# Patient Record
Sex: Female | Born: 1955 | Race: White | Hispanic: No | Smoking: Current every day smoker
Health system: Southern US, Community
[De-identification: ages and names within clinical notes are randomized; demographics above are authoritative.]

## PROBLEM LIST (undated history)

## (undated) DIAGNOSIS — E785 Hyperlipidemia, unspecified: Secondary | ICD-10-CM

## (undated) DIAGNOSIS — T8859XA Other complications of anesthesia, initial encounter: Secondary | ICD-10-CM

## (undated) DIAGNOSIS — W3400XA Accidental discharge from unspecified firearms or gun, initial encounter: Secondary | ICD-10-CM

## (undated) DIAGNOSIS — T4145XA Adverse effect of unspecified anesthetic, initial encounter: Secondary | ICD-10-CM

## (undated) DIAGNOSIS — E119 Type 2 diabetes mellitus without complications: Secondary | ICD-10-CM

## (undated) DIAGNOSIS — I10 Essential (primary) hypertension: Secondary | ICD-10-CM

## (undated) DIAGNOSIS — E079 Disorder of thyroid, unspecified: Secondary | ICD-10-CM

## (undated) HISTORY — PX: OOPHORECTOMY: SHX86

## (undated) HISTORY — PX: APPENDECTOMY: SHX54

## (undated) HISTORY — PX: ABDOMINAL HYSTERECTOMY: SHX81

## (undated) HISTORY — PX: CYST EXCISION: SHX5701

## (undated) HISTORY — PX: BLADDER REPAIR: SHX76

## (undated) HISTORY — PX: URINARY SPHINCTER REVISION: SHX2625

---

## 1998-03-30 ENCOUNTER — Other Ambulatory Visit: Admission: RE | Admit: 1998-03-30 | Discharge: 1998-03-30 | Payer: Self-pay | Admitting: Sports Medicine

## 1999-10-12 ENCOUNTER — Encounter: Admission: RE | Admit: 1999-10-12 | Discharge: 1999-10-12 | Payer: Self-pay | Admitting: Family Medicine

## 1999-10-19 ENCOUNTER — Emergency Department (HOSPITAL_COMMUNITY): Admission: EM | Admit: 1999-10-19 | Discharge: 1999-10-19 | Payer: Self-pay | Admitting: Emergency Medicine

## 2000-11-12 ENCOUNTER — Other Ambulatory Visit: Admission: RE | Admit: 2000-11-12 | Discharge: 2000-11-12 | Payer: Self-pay | Admitting: Obstetrics and Gynecology

## 2001-03-26 ENCOUNTER — Encounter: Payer: Self-pay | Admitting: Obstetrics and Gynecology

## 2001-04-01 ENCOUNTER — Inpatient Hospital Stay (HOSPITAL_COMMUNITY): Admission: RE | Admit: 2001-04-01 | Discharge: 2001-04-04 | Payer: Self-pay | Admitting: Obstetrics and Gynecology

## 2001-04-01 ENCOUNTER — Encounter (INDEPENDENT_AMBULATORY_CARE_PROVIDER_SITE_OTHER): Payer: Self-pay | Admitting: *Deleted

## 2001-04-20 ENCOUNTER — Encounter: Payer: Self-pay | Admitting: Obstetrics and Gynecology

## 2001-04-20 ENCOUNTER — Inpatient Hospital Stay (HOSPITAL_COMMUNITY): Admission: AD | Admit: 2001-04-20 | Discharge: 2001-04-20 | Payer: Self-pay | Admitting: Obstetrics and Gynecology

## 2006-04-10 ENCOUNTER — Ambulatory Visit: Payer: Self-pay | Admitting: Family Medicine

## 2006-04-15 ENCOUNTER — Ambulatory Visit: Payer: Self-pay | Admitting: Family Medicine

## 2006-06-07 ENCOUNTER — Ambulatory Visit: Payer: Self-pay | Admitting: Family Medicine

## 2006-11-26 ENCOUNTER — Ambulatory Visit: Payer: Self-pay | Admitting: Family Medicine

## 2006-11-26 LAB — CONVERTED CEMR LAB: TSH: 39.66 microintl units/mL — ABNORMAL HIGH (ref 0.35–5.50)

## 2006-12-25 ENCOUNTER — Ambulatory Visit: Payer: Self-pay | Admitting: Family Medicine

## 2006-12-25 LAB — CONVERTED CEMR LAB: TSH: 1.21 microintl units/mL (ref 0.35–5.50)

## 2007-02-24 DIAGNOSIS — E785 Hyperlipidemia, unspecified: Secondary | ICD-10-CM | POA: Insufficient documentation

## 2007-02-24 DIAGNOSIS — E039 Hypothyroidism, unspecified: Secondary | ICD-10-CM

## 2007-02-24 DIAGNOSIS — I10 Essential (primary) hypertension: Secondary | ICD-10-CM | POA: Insufficient documentation

## 2007-10-21 ENCOUNTER — Ambulatory Visit: Payer: Self-pay | Admitting: Internal Medicine

## 2007-10-21 DIAGNOSIS — E782 Mixed hyperlipidemia: Secondary | ICD-10-CM

## 2007-10-21 LAB — CONVERTED CEMR LAB
Cholesterol, target level: 200 mg/dL
HDL goal, serum: 40 mg/dL
LDL Goal: 130 mg/dL

## 2007-10-27 ENCOUNTER — Telehealth: Payer: Self-pay | Admitting: Internal Medicine

## 2008-02-10 ENCOUNTER — Telehealth (INDEPENDENT_AMBULATORY_CARE_PROVIDER_SITE_OTHER): Payer: Self-pay | Admitting: *Deleted

## 2011-02-09 NOTE — Discharge Summary (Signed)
[facility]  Patient:    Sierra Beck, Sierra Beck                      MRN: [id number] Adm. Date:  [id number] Disc. Date: [id number] Attending:  Frederich Balding                           Discharge Summary  ADMITTING DIAGNOSES: 1. Menorrhagia secondary to uterine fibroids. 2. Anatomical stress urinary incontinence.  DISCHARGE DIAGNOSES: 1. Menorrhagia secondary to uterine fibroids. 2. Anatomical stress urinary incontinence.  PROCEDURE:  Total abdominal hysterectomy with left salpingo-oophorectomy, Charletta Cousin procedure.  Cystoscopy with placement of suprapubic catheter.  For complete history and physical, please see dictated note.  HOSPITAL COURSE:  Patient underwent the above-noted surgery.  Pathology did reveal a 128 g uterus with some uterine fibroids, otherwise unremarkable.  Her right tube and ovary were surgically absent.  Left tube and ovary were unremarkable.  Postoperatively, she did well.  Postoperative hemoglobin was 11.7.  She had a little atelectasis on her first postoperative evening that responded to pulmonary toiletry.  Her diet was advanced and we began clamping the suprapubic.  By her third postoperative day, she was doing well; however, that morning, she had some nausea and vomiting and this did resolve with observation and her post-void residuals were minimal.  The suprapubic was discontinued and she was discharged home.  At the time of discharge, she was afebrile with stable vital signs.  Abdomen was soft and nontender.  A low-transverse incision was intact.  She was having minimal vaginal bleeding.  In terms of complications, none were encountered during stay in the hospital. Patient was discharged home in stable condition.  DISPOSITION:  Routine postoperative instruction orders given.  She was to avoid heavy lifting, vaginal entrance, or driving a car.  She was discharged home on Tylox as needed for pain.  Climara patch risks and benefits  have been discussed and iron sulfate supplementation.  She is to watch for signs of infection, nausea, vomiting, increasing abdominal pain, or active vaginal bleeding.  Follow up in the office in one week. DD:  04/04/01 TD:  04/04/01 Job: [id number]

## 2011-02-09 NOTE — H&P (Signed)
[facility]  Patient:    Sierra Beck, Sierra Beck                            MRN: [id number] Adm. Date:  04/01/01 Attending:  Juluis Mire, M.D.                         History and Physical  HISTORY OF PRESENT ILLNESS:  The patient is a 55 year old gravida 3, para 2, abortus 1, married white female who presents for total abdominal hysterectomy with left salpingo-oophorectomy and retropubic suspension of bladder.  The patient does complain of worsening menstrual cycles.  Cycles are now lasting approximately 6 to 7 days, five days of flow changing pads and tampons every four hours with associated clots.  No significant pelvic pain. Ultrasound confirms multiple uterine fibroids.  Also the patient is complaining of worsening stress urinary incontinence, becoming extremely limiting.  Bladder studies were consistent with anatomical stress urinary incontinence.  In view of this, the patient now presents for the above noted surgery.  Of note, she has had a previous right salpingo-oophorectomy.  At the time of the last frozen section she was noted to have extensive pelvic adhesions.  ALLERGIES:  No known drug allergies.  MEDICATIONS:  Synthroid.  PAST MEDICAL HISTORY: 1. Usual childhood disease, not significant. 2. History of hypothyroidism.  PAST SURGICAL HISTORY: 1. Previous right salpingo-oophorectomy. 2. She has had two prior cesarean sections. 3. Bilateral tubal ligation. 4. One TAB.  FAMILY HISTORY:  Noncontributory.  SOCIAL HISTORY:   No tobacco or alcohol use.  REVIEW OF SYSTEMS:  Noncontributory.  PHYSICAL EXAMINATION:  VITAL SIGNS:  The patient is afebrile with stable vital signs.  HEENT:  Normocephalic.  Pupils are equal, round and reactive to light and accommodation.  Extraocular movements intact.  Sclerae and conjunctivae clear. Oropharynx clear.  NECK:  Without thyromegaly.  BREASTS:  No discrete masses.  LUNGS:  Clear.  CARDIAC:   Regular rate and rhythm, no murmurs or gallops.  ABDOMEN:  Benign.  PELVIC:  Normal external genitalia, vaginal cuff clear, cervix unremarkable. Uterus is upper limits of normal size, slightly irregular, adnexa unremarkable.  Rectovaginal examination is clear.  EXTREMITIES:  Trace edema.  NEUROLOGIC:  Grossly within normal limits.  IMPRESSION: 1. Uterine fibroids with associated menorrhagia. 2. Anatomical stress urinary incontinence. 3. Hypothyroidism.  PLAN:  The patient will undergo the above noted surgery.  The risks of surgery have been discussed, including the risk of infection.  The risk of hemorrhage. This could require transfusion with the risks of AIDS or hepatitis.  The risk of injury to adjacent organs, including bladder, bowel, or ureters, that could require further exploratory surgery.  The risk of deep venous thrombosis and pulmonary embolus.  In terms of the bladder suspension and the need for suprapubic catheter placement and bladder retraining have been discussed.  The risk of long-term catheterization discussed.  Sometime surgical re-correction may be undertaken.DD:  04/01/01 TD:  04/01/01 Job: [id number]

## 2011-02-09 NOTE — Op Note (Signed)
[facility]  Patient:    Sierra Beck, Sierra Beck                      MRN: [id number] Proc. Date: 04/01/01 Adm. Date:  [id number] Attending:  Frederich Balding                           Operative Report  PREOPERATIVE DIAGNOSES:  Uterine fibroids. Stress urinary incontinence.  POSTOPERATIVE DIAGNOSES:  Uterine fibroids. Stress urinary incontinence. Pelvic adhesions.  PROCEDURE:  Exploratory laparotomy with lysis of adhesions, total abdominal hysterectomy, left salpingo-oophorectomy. Retropubic suspension of the bladder using a Burch procedure. Cystostomy with cystoscopy. Placement of suprapubic catheter.  SURGEON:  Dr. Arelia Sneddon.  ASSISTANT:  Dr. Marcelle Overlie.  ANESTHESIA:  General endotracheal.  ESTIMATED BLOOD LOSS:  400 cc.  PACKS/DRAINS:  None.  INTRAOPERATIVE BLOOD REPLACED:  None.  COMPLICATIONS:  None.  INDICATIONS FOR PROCEDURE:  Dictated in the history and physical.  DESCRIPTION OF PROCEDURE:  The patient was taken to the operating room and placed in supine position. After a satisfactory level of general endotracheal anesthesia was obtained, the patient was placed in the dorsal lithotomy position using the Allen stirrups. The abdomen, perineum and vagina were prepped out with Betadine and draped as a sterile field. A three way Foley had been put in place. A low transverse skin incision was made with a knife and carried through the subcutaneous tissue. The anterior rectus fascia was entered sharply and incision in fascia extended laterally. The fascia was then taken off the muscle superiorly and inferiorly using both blunt and sharp dissection. The rectus muscles were separated in the midline. The perineum was entered sharply, incision in  perineum extended both superiorly and inferiorly. She did have omental adhesions on the anterior aspect of the incision and these were taken down sharply. The right tube and ovary were surgically absent. The  left ovary was encased in adhesions to the sigmoid colon. These were taken down sharply. The cul-de-sac was basically free. The uterus was upper limits of normal size. Palpation in the upper abdomen revealed both kidneys to be of normal size and shape, the appendix was surgically absent. An OConnor-OSullivan retractor was put in place and bowel contents were packed superiorly out of the pelvic cavity. At this point in time, the left round ligament was clamped, cut and suture ligated with #0 Vicryl. The left retroperitoneal space was developed. The left ureter was identified, the left ovarian vasculature was isolated above the ureter. This was then clamped, cut, and doubly ligated first with a free tie of #0 Vicryl and then a suture ligature of #0 Vicryl. We had good hemostasis. We then developed a bladder flap. There were some adhesions in this area. Next, the right round ligament was clamped, cut and suture ligated with #0 Vicryl. There was no adnexa on this side, therefore, we took down the broad ligament that was remaining. The uterine vessels were skeletonized, clamped, cut and suture ligated with #0 Vicryl. The bladder was further dissected off the lower uterine cervical segment. Using the clamp, cut and tie technique with suture ligatures of #0 Vicryl, the perimetrium was serially separated from the side of the uterus. The vaginal angles were then clamped, cut and suture ligated. The intervening vaginal mucosa was excised. The remaining vaginal mucosa was closed with underlying suture of #0 Vicryl. Some bleeding was noted from the posterior cuff brought under control with the figure-of-eight of #  0 Vicryl. Visualization of the remaining pelvic cavity revealed excellent hemostasis. We thoroughly irrigated the pelvis. The cul-de-sac was short. We did not do a culdoplasty as the colon sat down in this area nicely and due to the previous adhesions, we felt this would stick down nicely. The  left ovarian vasculature was hemostatically intact. Urine output remained clear and adequate. We evaluated the omentum, there was no active bleeding form this. She had a few periumbilical adhesions and these were left intact. At this point in time, all packs were removed along with a self retaining retractor, the peritoneum was closed with a running suture of 2-0 Vicryl.  Next, the retroperitoneal space was developed. A gloved hand was placed in the vaginal vault. We then used individual ligatures of 2-0 Prolene to form the Burch procedure. Two sutures were placed and the urethrovesical angle on each side and then two are placed more laterally. These are all secured at [facility] ligament and tied down. We had excellent elevation of the urethrovesical angle. At this point in time, the bladder was distended with saline. A cystostomy incision was made in the dome of the bladder and the cystoscope introduced. There was not suture material through the mucosa, both ureteral orifices were noted. The patient had been given indigo carmine through the IV. We had free spillage of blue urine bilaterally. Next the cystostomy incision was closed. First the mucosa closed with running suture of 3-0 chromic, the muscularis was brought together with interrupted sutures of 3-0 chromic and then a third layer of 3-0 Vicryl. We then reached into the bladder and the incision was intact with no evidence of leakage of the irrigant. A Bonnano catheter was put in place and secured to the skin. At this point, muscles reapproximated with running suture of 3-0 Vicryl. The fascia was closed with a running suture of #0 PDS, the skin was closed with staples and Steri-Strips. Sponge, needle and instrument counts reported as correct by the circulating nurse x 2. Urine output was adequate. The patient tolerated the procedure well and was extubated and transferred to the recovery room in good condition. DD:  04/02/01 TD:   04/02/01 Job: [id number]

## 2011-02-09 NOTE — Assessment & Plan Note (Signed)
HEALTHCARE                          [facility] OFFICE NOTE   NAME:Sierra Beck, Sierra Beck                      MRN:          [id number]  DATE:04/10/2006                            DOB:          [birth year]    REASON FOR VISIT:  Establish care.  Needs refill on medicines.   [name] is a 55 year old female, who is establishing care here at [facility]  She reports that she was previously seen at [facility] for  medical problems, but decided to be switched over here after having a  disagreement with her previous physician.  She states that she has been  having severe headaches for a while and attributed it to a medicine she was  taking.  Due to that, she stopped all her medicines and this led to a  disagreement with her physician.  After stopping all her medicines, her  headaches have completely resolved.  She then restarted her Ziac and  levothyroxine, and continues to be headache-free.  The medicine that  appeared to have caused her headaches was lovastatin, which, according to  her, also caused headaches in her mother.  She is here to reestablish care.   PAST MEDICAL HISTORY:  1.  Hypertension.  2.  Hyperlipidemia, specifically elevated triglycerides.  3.  Hypothyroidism.   SURGICAL HISTORY:  Hysterectomy due to dysfunctional uterine bleeding.   MEDICATIONS:  1.  Ziac 2.5/6.25 daily.  2.  Levothyroxine 0.112 mg daily.  3.  Over-the-counter sleep aid 1 daily.   ALLERGIES:  CODEINE.   FAMILY HISTORY:  Father and mother have a history of hypertension.   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  She denies any  alcohol use, but smokes 1 pack a day.   REVIEW OF SYSTEMS:  As per HPI, and otherwise unremarkable except for a long  history of chronic insomnia.   OBJECTIVE:  Blood pressure 144/100, weight of 182 pounds, pulse is 72.  GENERAL:  We have a pleasant female in no acute distress, answers questions  appropriately.  HEENT:  Unremarkable.  NECK:  Supple, no lymphadenopathy, carotid bruits or JVD.  LUNGS:  Clear.  HEART:  Regular rate and rhythm, normal S1, S2, no murmurs, gallops or rubs.  EXTREMITIES:  No clubbing, cyanosis or edema.   IMPRESSION:  A 55 year old female with history of hypertension,  hyperlipidemia and hypothyroid disease.  She has been off her medicines for  2 weeks, and is here to reestablish care and obtain refills on her  medications.   PLAN:  1.  She agreed to having a scheduled appointment for a lab visit tomorrow or      the following day, to include lipid, AST, ALT, basic metabolic profile      and a TSH.  2.  I refilled her prescriptions x1.  3.  We will have the patient follow up with me in 4 weeks, and sooner if      needed.  4.  We will refer patient to a nutritionist to help with the elevated      triglycerides.  5.  I did advise that there is a possibility that I may start her on      Niaspan.  I did review the possible side effects, and she is in      agreement if needed.  6.  Further recommendations after review of the laboratory data.                                   Leanne Chang, MD   LA/MedQ  DD:  04/10/2006  DT:  04/11/2006  Job #:  [id number]

## 2013-03-08 ENCOUNTER — Emergency Department (HOSPITAL_BASED_OUTPATIENT_CLINIC_OR_DEPARTMENT_OTHER)
Admission: EM | Admit: 2013-03-08 | Discharge: 2013-03-08 | Disposition: A | Discharge status: Home / Self Care | Attending: Emergency Medicine | Admitting: Emergency Medicine

## 2013-03-08 ENCOUNTER — Encounter (HOSPITAL_BASED_OUTPATIENT_CLINIC_OR_DEPARTMENT_OTHER): Payer: Self-pay

## 2013-03-08 DIAGNOSIS — Z87828 Personal history of other (healed) physical injury and trauma: Secondary | ICD-10-CM | POA: Insufficient documentation

## 2013-03-08 DIAGNOSIS — J45909 Unspecified asthma, uncomplicated: Secondary | ICD-10-CM | POA: Insufficient documentation

## 2013-03-08 DIAGNOSIS — I10 Essential (primary) hypertension: Secondary | ICD-10-CM | POA: Insufficient documentation

## 2013-03-08 DIAGNOSIS — R221 Localized swelling, mass and lump, neck: Secondary | ICD-10-CM | POA: Insufficient documentation

## 2013-03-08 DIAGNOSIS — R220 Localized swelling, mass and lump, head: Secondary | ICD-10-CM | POA: Insufficient documentation

## 2013-03-08 DIAGNOSIS — Z79899 Other long term (current) drug therapy: Secondary | ICD-10-CM | POA: Insufficient documentation

## 2013-03-08 DIAGNOSIS — E079 Disorder of thyroid, unspecified: Secondary | ICD-10-CM | POA: Insufficient documentation

## 2013-03-08 DIAGNOSIS — E785 Hyperlipidemia, unspecified: Secondary | ICD-10-CM | POA: Insufficient documentation

## 2013-03-08 DIAGNOSIS — F17200 Nicotine dependence, unspecified, uncomplicated: Secondary | ICD-10-CM | POA: Insufficient documentation

## 2013-03-08 DIAGNOSIS — L259 Unspecified contact dermatitis, unspecified cause: Secondary | ICD-10-CM | POA: Insufficient documentation

## 2013-03-08 DIAGNOSIS — T7840XA Allergy, unspecified, initial encounter: Secondary | ICD-10-CM

## 2013-03-08 HISTORY — DX: Accidental discharge from unspecified firearms or gun, initial encounter: W34.00XA

## 2013-03-08 HISTORY — DX: Hyperlipidemia, unspecified: E78.5

## 2013-03-08 HISTORY — DX: Essential (primary) hypertension: I10

## 2013-03-08 HISTORY — DX: Disorder of thyroid, unspecified: E07.9

## 2013-03-08 LAB — BASIC METABOLIC PANEL
CO2: 28 mEq/L (ref 19–32)
Calcium: 9.8 mg/dL (ref 8.4–10.5)
Creatinine, Ser: 0.7 mg/dL (ref 0.50–1.10)
GFR calc Af Amer: >90 mL/min (ref >90)
GFR calc non Af Amer: >90 mL/min (ref >90)
Sodium: 141 mEq/L (ref 135–145)

## 2013-03-08 LAB — CBC WITH DIFFERENTIAL/PLATELET
Basophils Absolute: 0 10*3/uL (ref 0.0–0.1)
Basophils Relative: 0 % (ref 0–1)
Eosinophils Relative: 6 % — ABNORMAL HIGH (ref 0–5)
HCT: 47.6 % — ABNORMAL HIGH (ref 36.0–46.0)
Lymphocytes Relative: 29 % (ref 12–46)
MCHC: 34.2 g/dL (ref 30.0–36.0)
MCV: 90.7 fL (ref 78.0–100.0)
Monocytes Absolute: 0.4 10*3/uL (ref 0.1–1.0)
Platelets: 217 10*3/uL (ref 150–400)
RDW: 13 % (ref 11.5–15.5)
WBC: 7 10*3/uL (ref 4.0–10.5)

## 2013-03-08 LAB — URINALYSIS, ROUTINE W REFLEX MICROSCOPIC
Glucose, UA: NEGATIVE mg/dL
Ketones, ur: NEGATIVE mg/dL
Leukocytes, UA: NEGATIVE
Nitrite: NEGATIVE
Protein, ur: NEGATIVE mg/dL
Urobilinogen, UA: 0.2 mg/dL (ref 0.0–1.0)

## 2013-03-08 MED ORDER — FAMOTIDINE IN NACL 20-0.9 MG/50ML-% IV SOLN
20.0000 mg | Freq: Once | INTRAVENOUS | Status: AC
  Administered 2013-03-08: 20 mg via INTRAVENOUS
  Filled 2013-03-08: qty 50

## 2013-03-08 MED ORDER — PREDNISONE 20 MG PO TABS: 40.0000 mg | ORAL_TABLET | Freq: Every day | ORAL | Status: DC

## 2013-03-08 MED ORDER — DIPHENHYDRAMINE HCL 50 MG/ML IJ SOLN
25.0000 mg | Freq: Once | INTRAMUSCULAR | Status: AC
  Administered 2013-03-08: 25 mg via INTRAVENOUS
  Filled 2013-03-08: qty 1

## 2013-03-08 MED ORDER — SODIUM CHLORIDE 0.9 % IV BOLUS (SEPSIS)
1000.0000 mL | Freq: Once | INTRAVENOUS | Status: AC
  Administered 2013-03-08: 1000 mL via INTRAVENOUS

## 2013-03-08 MED ORDER — METHYLPREDNISOLONE SODIUM SUCC 125 MG IJ SOLR
125.0000 mg | Freq: Once | INTRAMUSCULAR | Status: AC
  Administered 2013-03-08: 125 mg via INTRAVENOUS
  Filled 2013-03-08: qty 2

## 2013-03-08 NOTE — ED Notes (Signed)
Two attempts at IV insertion by this RN were unsuccessful, Crystal, RRT to attempt to insert PIV.

## 2013-03-08 NOTE — ED Notes (Signed)
Pt states that she woke up yesterday morning with skin tightness (swelling) to the face, neck, shoulders, and upper back.  Pt states that she reviewed her activies from the day before and found nothing that she may have been allergic to, took benadryl and saw little to no improvement throughout the day despite taking multiple doses.  Pt states that she woke up today with increasing tightness, swelling, and redness to the face, neck, shoulders and upper back.  No airway compromise, maintaining control of secretions well, in no apparent distress at this time.  Vital signs stable, will continue to monitor.
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
## 2013-03-08 NOTE — ED Provider Notes (Signed)
Medical screening examination/treatment/procedure(s) were performed by non-physician practitioner and as supervising physician I was immediately available for consultation/collaboration.  Geoffery Lyons, MD 03/08/13 [phone]

## 2014-03-11 ENCOUNTER — Emergency Department (HOSPITAL_BASED_OUTPATIENT_CLINIC_OR_DEPARTMENT_OTHER)

## 2014-03-11 ENCOUNTER — Encounter (HOSPITAL_BASED_OUTPATIENT_CLINIC_OR_DEPARTMENT_OTHER): Payer: Self-pay | Admitting: Emergency Medicine

## 2014-03-11 ENCOUNTER — Emergency Department (HOSPITAL_BASED_OUTPATIENT_CLINIC_OR_DEPARTMENT_OTHER)
Admission: EM | Admit: 2014-03-11 | Discharge: 2014-03-11 | Disposition: A | Discharge status: Home / Self Care | Attending: Emergency Medicine | Admitting: Emergency Medicine

## 2014-03-11 DIAGNOSIS — H53149 Visual discomfort, unspecified: Secondary | ICD-10-CM | POA: Insufficient documentation

## 2014-03-11 DIAGNOSIS — I10 Essential (primary) hypertension: Secondary | ICD-10-CM | POA: Insufficient documentation

## 2014-03-11 DIAGNOSIS — J45909 Unspecified asthma, uncomplicated: Secondary | ICD-10-CM | POA: Insufficient documentation

## 2014-03-11 DIAGNOSIS — IMO0002 Reserved for concepts with insufficient information to code with codable children: Secondary | ICD-10-CM | POA: Insufficient documentation

## 2014-03-11 DIAGNOSIS — J0100 Acute maxillary sinusitis, unspecified: Secondary | ICD-10-CM | POA: Insufficient documentation

## 2014-03-11 DIAGNOSIS — Z87828 Personal history of other (healed) physical injury and trauma: Secondary | ICD-10-CM | POA: Insufficient documentation

## 2014-03-11 DIAGNOSIS — E079 Disorder of thyroid, unspecified: Secondary | ICD-10-CM | POA: Insufficient documentation

## 2014-03-11 DIAGNOSIS — E785 Hyperlipidemia, unspecified: Secondary | ICD-10-CM | POA: Insufficient documentation

## 2014-03-11 DIAGNOSIS — R110 Nausea: Secondary | ICD-10-CM | POA: Insufficient documentation

## 2014-03-11 DIAGNOSIS — R51 Headache: Secondary | ICD-10-CM | POA: Insufficient documentation

## 2014-03-11 DIAGNOSIS — F17200 Nicotine dependence, unspecified, uncomplicated: Secondary | ICD-10-CM | POA: Insufficient documentation

## 2014-03-11 DIAGNOSIS — R519 Headache, unspecified: Secondary | ICD-10-CM

## 2014-03-11 DIAGNOSIS — Z79899 Other long term (current) drug therapy: Secondary | ICD-10-CM | POA: Insufficient documentation

## 2014-03-11 MED ORDER — DIPHENHYDRAMINE HCL 50 MG/ML IJ SOLN
25.0000 mg | Freq: Once | INTRAMUSCULAR | Status: AC
  Administered 2014-03-11: 25 mg via INTRAVENOUS
  Filled 2014-03-11: qty 1

## 2014-03-11 MED ORDER — KETOROLAC TROMETHAMINE 30 MG/ML IJ SOLN
30.0000 mg | Freq: Once | INTRAMUSCULAR | Status: AC
  Administered 2014-03-11: 30 mg via INTRAVENOUS
  Filled 2014-03-11: qty 1

## 2014-03-11 MED ORDER — AMOXICILLIN 500 MG PO CAPS: 500.0000 mg | ORAL_CAPSULE | Freq: Three times a day (TID) | ORAL | Status: DC

## 2014-03-11 MED ORDER — HYDROMORPHONE HCL PF 1 MG/ML IJ SOLN
1.0000 mg | Freq: Once | INTRAMUSCULAR | Status: AC
  Administered 2014-03-11: 1 mg via INTRAVENOUS
  Filled 2014-03-11: qty 1

## 2014-03-11 MED ORDER — SODIUM CHLORIDE 0.9 % IV BOLUS (SEPSIS)
1000.0000 mL | Freq: Once | INTRAVENOUS | Status: AC
  Administered 2014-03-11: 1000 mL via INTRAVENOUS

## 2014-03-11 MED ORDER — METOCLOPRAMIDE HCL 5 MG/ML IJ SOLN
10.0000 mg | Freq: Once | INTRAMUSCULAR | Status: AC
  Administered 2014-03-11: 10 mg via INTRAVENOUS
  Filled 2014-03-11: qty 2
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
## 2014-03-11 NOTE — ED Notes (Signed)
Headache x 5 days. She drove herself here.

## 2014-03-11 NOTE — ED Provider Notes (Signed)
Medical screening examination/treatment/procedure(s) were performed by non-physician practitioner and as supervising physician I was immediately available for consultation/collaboration.   EKG Interpretation None       Orlie Dakin, MD 03/11/14 2358

## 2014-03-11 NOTE — Discharge Instructions (Signed)
Take amoxicillin as directed until gone. Refer to attached documents for more information.  °

## 2015-03-14 ENCOUNTER — Encounter: Payer: Self-pay | Admitting: Nurse Practitioner

## 2017-03-13 ENCOUNTER — Other Ambulatory Visit: Payer: Self-pay | Admitting: Sports Medicine

## 2017-03-13 DIAGNOSIS — M546 Pain in thoracic spine: Secondary | ICD-10-CM

## 2017-03-18 ENCOUNTER — Other Ambulatory Visit: Payer: Self-pay | Admitting: Sports Medicine

## 2017-03-18 ENCOUNTER — Ambulatory Visit
Admission: RE | Admit: 2017-03-18 | Discharge: 2017-03-18 | Disposition: A | Discharge status: Home / Self Care | Source: Ambulatory Visit | Attending: Sports Medicine | Admitting: Sports Medicine

## 2017-03-18 ENCOUNTER — Ambulatory Visit
Admission: RE | Admit: 2017-03-18 | Discharge: 2017-03-18 | Disposition: A | Discharge status: Home / Self Care | Payer: Self-pay | Source: Ambulatory Visit | Attending: Sports Medicine | Admitting: Sports Medicine

## 2017-03-18 DIAGNOSIS — M546 Pain in thoracic spine: Secondary | ICD-10-CM

## 2017-06-10 ENCOUNTER — Encounter: Payer: Self-pay | Admitting: Gastroenterology

## 2017-06-11 ENCOUNTER — Encounter (HOSPITAL_COMMUNITY): Payer: Self-pay | Admitting: *Deleted

## 2017-06-11 ENCOUNTER — Inpatient Hospital Stay (HOSPITAL_COMMUNITY)
Admission: EM | Admit: 2017-06-11 | Discharge: 2017-06-19 | DRG: 357 | Disposition: A | Discharge status: Hospice | Attending: Nephrology | Admitting: Nephrology

## 2017-06-11 ENCOUNTER — Emergency Department (HOSPITAL_COMMUNITY)

## 2017-06-11 DIAGNOSIS — R188 Other ascites: Secondary | ICD-10-CM | POA: Diagnosis present

## 2017-06-11 DIAGNOSIS — K5900 Constipation, unspecified: Secondary | ICD-10-CM | POA: Diagnosis not present

## 2017-06-11 DIAGNOSIS — R1110 Vomiting, unspecified: Secondary | ICD-10-CM

## 2017-06-11 DIAGNOSIS — C801 Malignant (primary) neoplasm, unspecified: Secondary | ICD-10-CM | POA: Diagnosis not present

## 2017-06-11 DIAGNOSIS — Z23 Encounter for immunization: Secondary | ICD-10-CM

## 2017-06-11 DIAGNOSIS — Z885 Allergy status to narcotic agent status: Secondary | ICD-10-CM

## 2017-06-11 DIAGNOSIS — Z515 Encounter for palliative care: Secondary | ICD-10-CM

## 2017-06-11 DIAGNOSIS — R1900 Intra-abdominal and pelvic swelling, mass and lump, unspecified site: Secondary | ICD-10-CM | POA: Diagnosis not present

## 2017-06-11 DIAGNOSIS — C259 Malignant neoplasm of pancreas, unspecified: Secondary | ICD-10-CM | POA: Diagnosis present

## 2017-06-11 DIAGNOSIS — R112 Nausea with vomiting, unspecified: Secondary | ICD-10-CM | POA: Diagnosis present

## 2017-06-11 DIAGNOSIS — R630 Anorexia: Secondary | ICD-10-CM | POA: Diagnosis not present

## 2017-06-11 DIAGNOSIS — E119 Type 2 diabetes mellitus without complications: Secondary | ICD-10-CM | POA: Diagnosis present

## 2017-06-11 DIAGNOSIS — K869 Disease of pancreas, unspecified: Secondary | ICD-10-CM | POA: Diagnosis not present

## 2017-06-11 DIAGNOSIS — K8689 Other specified diseases of pancreas: Secondary | ICD-10-CM

## 2017-06-11 DIAGNOSIS — Z90710 Acquired absence of both cervix and uterus: Secondary | ICD-10-CM | POA: Diagnosis not present

## 2017-06-11 DIAGNOSIS — Z7982 Long term (current) use of aspirin: Secondary | ICD-10-CM | POA: Diagnosis not present

## 2017-06-11 DIAGNOSIS — C787 Secondary malignant neoplasm of liver and intrahepatic bile duct: Secondary | ICD-10-CM | POA: Diagnosis present

## 2017-06-11 DIAGNOSIS — E876 Hypokalemia: Secondary | ICD-10-CM | POA: Diagnosis present

## 2017-06-11 DIAGNOSIS — I10 Essential (primary) hypertension: Secondary | ICD-10-CM | POA: Diagnosis present

## 2017-06-11 DIAGNOSIS — G893 Neoplasm related pain (acute) (chronic): Secondary | ICD-10-CM | POA: Diagnosis present

## 2017-06-11 DIAGNOSIS — D638 Anemia in other chronic diseases classified elsewhere: Secondary | ICD-10-CM | POA: Diagnosis present

## 2017-06-11 DIAGNOSIS — C786 Secondary malignant neoplasm of retroperitoneum and peritoneum: Secondary | ICD-10-CM | POA: Diagnosis present

## 2017-06-11 DIAGNOSIS — C799 Secondary malignant neoplasm of unspecified site: Secondary | ICD-10-CM | POA: Diagnosis present

## 2017-06-11 DIAGNOSIS — F419 Anxiety disorder, unspecified: Secondary | ICD-10-CM | POA: Diagnosis present

## 2017-06-11 DIAGNOSIS — R1084 Generalized abdominal pain: Secondary | ICD-10-CM

## 2017-06-11 DIAGNOSIS — R109 Unspecified abdominal pain: Secondary | ICD-10-CM | POA: Diagnosis present

## 2017-06-11 DIAGNOSIS — E785 Hyperlipidemia, unspecified: Secondary | ICD-10-CM | POA: Diagnosis present

## 2017-06-11 DIAGNOSIS — R634 Abnormal weight loss: Secondary | ICD-10-CM | POA: Diagnosis not present

## 2017-06-11 DIAGNOSIS — F17210 Nicotine dependence, cigarettes, uncomplicated: Secondary | ICD-10-CM | POA: Diagnosis present

## 2017-06-11 DIAGNOSIS — Z79899 Other long term (current) drug therapy: Secondary | ICD-10-CM

## 2017-06-11 DIAGNOSIS — K5909 Other constipation: Secondary | ICD-10-CM | POA: Diagnosis not present

## 2017-06-11 HISTORY — DX: Type 2 diabetes mellitus without complications: E11.9

## 2017-06-11 HISTORY — DX: Adverse effect of unspecified anesthetic, initial encounter: T41.45XA

## 2017-06-11 HISTORY — DX: Other complications of anesthesia, initial encounter: T88.59XA

## 2017-06-11 LAB — COMPREHENSIVE METABOLIC PANEL
ALT: 13 U/L — ABNORMAL LOW (ref 14–54)
ANION GAP: 11 (ref 5–15)
AST: 23 U/L (ref 15–41)
Albumin: 3.2 g/dL — ABNORMAL LOW (ref 3.5–5.0)
Alkaline Phosphatase: 133 U/L — ABNORMAL HIGH (ref 38–126)
BILIRUBIN TOTAL: 0.4 mg/dL (ref 0.3–1.2)
BUN: 12 mg/dL (ref 6–20)
CHLORIDE: 99 mmol/L — AB (ref 101–111)
CO2: 26 mmol/L (ref 22–32)
Calcium: 8.9 mg/dL (ref 8.9–10.3)
Creatinine, Ser: 0.71 mg/dL (ref 0.44–1.00)
Glucose, Bld: 127 mg/dL — ABNORMAL HIGH (ref 65–99)
POTASSIUM: 3.3 mmol/L — AB (ref 3.5–5.1)
Sodium: 136 mmol/L (ref 135–145)
TOTAL PROTEIN: 6.7 g/dL (ref 6.5–8.1)

## 2017-06-11 LAB — URINALYSIS, ROUTINE W REFLEX MICROSCOPIC
BILIRUBIN URINE: NEGATIVE
GLUCOSE, UA: NEGATIVE mg/dL
HGB URINE DIPSTICK: NEGATIVE
Ketones, ur: NEGATIVE mg/dL
LEUKOCYTES UA: NEGATIVE
NITRITE: NEGATIVE
Protein, ur: 100 mg/dL — AB
Specific Gravity, Urine: 1.023 (ref 1.005–1.030)
pH: 6 (ref 5.0–8.0)

## 2017-06-11 LAB — CBC
HEMATOCRIT: 36.7 % (ref 36.0–46.0)
Hemoglobin: 11.8 g/dL — ABNORMAL LOW (ref 12.0–15.0)
MCH: 28.8 pg (ref 26.0–34.0)
MCHC: 32.2 g/dL (ref 30.0–36.0)
MCV: 89.5 fL (ref 78.0–100.0)
Platelets: 345 10*3/uL (ref 150–400)
RBC: 4.1 MIL/uL (ref 3.87–5.11)
RDW: 13.2 % (ref 11.5–15.5)
WBC: 9.8 10*3/uL (ref 4.0–10.5)

## 2017-06-11 LAB — LIPASE, BLOOD: LIPASE: 24 U/L (ref 11–51)

## 2017-06-11 MED ORDER — POTASSIUM CHLORIDE CRYS ER 20 MEQ PO TBCR
20.0000 meq | EXTENDED_RELEASE_TABLET | Freq: Once | ORAL | Status: AC
  Administered 2017-06-11: 20 meq via ORAL
  Filled 2017-06-11: qty 1

## 2017-06-11 MED ORDER — MORPHINE SULFATE (PF) 4 MG/ML IV SOLN
2.0000 mg | INTRAVENOUS | Status: DC | PRN
  Administered 2017-06-11 – 2017-06-12 (×3): 4 mg via INTRAVENOUS
  Filled 2017-06-11 (×3): qty 1

## 2017-06-11 MED ORDER — IOPAMIDOL (ISOVUE-300) INJECTION 61%
INTRAVENOUS | Status: AC
  Administered 2017-06-11: 100 mL
  Filled 2017-06-11: qty 100

## 2017-06-11 MED ORDER — LEVOTHYROXINE SODIUM 88 MCG PO TABS
88.0000 ug | ORAL_TABLET | Freq: Every day | ORAL | Status: DC
  Administered 2017-06-12 – 2017-06-19 (×8): 88 ug via ORAL
  Filled 2017-06-11 (×9): qty 1

## 2017-06-11 MED ORDER — NAPROXEN 250 MG PO TABS
250.0000 mg | ORAL_TABLET | Freq: Two times a day (BID) | ORAL | Status: DC | PRN
  Administered 2017-06-18: 500 mg via ORAL
  Filled 2017-06-11: qty 2

## 2017-06-11 MED ORDER — PRAMIPEXOLE DIHYDROCHLORIDE 0.125 MG PO TABS
0.1250 mg | ORAL_TABLET | Freq: Every day | ORAL | Status: DC
  Administered 2017-06-12 – 2017-06-18 (×8): 0.125 mg via ORAL
  Filled 2017-06-11 (×8): qty 1

## 2017-06-11 MED ORDER — SODIUM CHLORIDE 0.9 % IV SOLN
INTRAVENOUS | Status: DC
  Administered 2017-06-11 – 2017-06-14 (×4): via INTRAVENOUS
  Administered 2017-06-15: 1 mL via INTRAVENOUS
  Administered 2017-06-16 – 2017-06-18 (×4): via INTRAVENOUS

## 2017-06-11 MED ORDER — SIMETHICONE 80 MG PO CHEW
80.0000 mg | CHEWABLE_TABLET | Freq: Four times a day (QID) | ORAL | Status: DC | PRN
  Administered 2017-06-13: 80 mg via ORAL
  Filled 2017-06-11: qty 1

## 2017-06-11 MED ORDER — MORPHINE SULFATE (PF) 4 MG/ML IV SOLN
4.0000 mg | Freq: Once | INTRAVENOUS | Status: AC
  Administered 2017-06-11: 4 mg via INTRAVENOUS
  Filled 2017-06-11: qty 1

## 2017-06-11 MED ORDER — ONDANSETRON HCL 4 MG/2ML IJ SOLN
4.0000 mg | Freq: Once | INTRAMUSCULAR | Status: AC
  Administered 2017-06-11: 4 mg via INTRAVENOUS
  Filled 2017-06-11: qty 2

## 2017-06-11 MED ORDER — ASPIRIN EC 81 MG PO TBEC
81.0000 mg | DELAYED_RELEASE_TABLET | Freq: Every day | ORAL | Status: DC
  Administered 2017-06-12 – 2017-06-19 (×7): 81 mg via ORAL
  Filled 2017-06-11 (×8): qty 1

## 2017-06-11 MED ORDER — LORAZEPAM 2 MG/ML IJ SOLN: 2.0000 mg | Freq: Once | INTRAMUSCULAR | Status: DC

## 2017-06-11 MED ORDER — ONDANSETRON HCL 4 MG/2ML IJ SOLN: 4.0000 mg | Freq: Four times a day (QID) | INTRAMUSCULAR | Status: DC | PRN

## 2017-06-11 MED ORDER — ACETAMINOPHEN 325 MG PO TABS: 650.0000 mg | ORAL_TABLET | Freq: Four times a day (QID) | ORAL | Status: DC | PRN

## 2017-06-11 MED ORDER — DIPHENHYDRAMINE-APAP (SLEEP) 25-500 MG PO TABS: 2.0000 | ORAL_TABLET | Freq: Every day | ORAL | Status: DC

## 2017-06-11 MED ORDER — PANTOPRAZOLE SODIUM 20 MG PO TBEC
20.0000 mg | DELAYED_RELEASE_TABLET | Freq: Every day | ORAL | Status: DC
  Administered 2017-06-12 – 2017-06-14 (×3): 20 mg via ORAL
  Filled 2017-06-11 (×3): qty 1

## 2017-06-11 MED ORDER — ACETAMINOPHEN 500 MG PO TABS
1000.0000 mg | ORAL_TABLET | Freq: Every day | ORAL | Status: DC
  Administered 2017-06-12 – 2017-06-18 (×8): 1000 mg via ORAL
  Filled 2017-06-11 (×8): qty 2

## 2017-06-11 MED ORDER — ONDANSETRON HCL 4 MG PO TABS: 4.0000 mg | ORAL_TABLET | Freq: Four times a day (QID) | ORAL | Status: DC | PRN

## 2017-06-11 MED ORDER — BISOPROLOL-HYDROCHLOROTHIAZIDE 2.5-6.25 MG PO TABS
1.0000 | ORAL_TABLET | Freq: Every day | ORAL | Status: DC
  Administered 2017-06-12 – 2017-06-19 (×7): 1 via ORAL
  Filled 2017-06-11 (×8): qty 1

## 2017-06-11 MED ORDER — DIPHENHYDRAMINE HCL 25 MG PO CAPS
50.0000 mg | ORAL_CAPSULE | Freq: Every day | ORAL | Status: DC
  Administered 2017-06-11 – 2017-06-18 (×8): 50 mg via ORAL
  Filled 2017-06-11 (×8): qty 2

## 2017-06-11 MED ORDER — ROSUVASTATIN CALCIUM 10 MG PO TABS
10.0000 mg | ORAL_TABLET | Freq: Every evening | ORAL | Status: DC
  Administered 2017-06-12 – 2017-06-18 (×7): 10 mg via ORAL
  Filled 2017-06-11 (×8): qty 1

## 2017-06-11 MED ORDER — ACETAMINOPHEN 650 MG RE SUPP: 650.0000 mg | Freq: Four times a day (QID) | RECTAL | Status: DC | PRN
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
## 2017-06-11 NOTE — ED Triage Notes (Signed)
Pt reports ongoing back pain that radiates to her abdomen. Pt states that this has been ongoing for several months. Pt states that she has seen a MD and had CT and xrays completed.
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
## 2017-06-11 NOTE — ED Notes (Signed)
Patient transported to CT 

## 2017-06-11 NOTE — ED Notes (Signed)
Attempted X2 to get blood unsuccessfull

## 2017-06-12 ENCOUNTER — Encounter (HOSPITAL_COMMUNITY): Payer: Self-pay | Admitting: Radiology

## 2017-06-12 ENCOUNTER — Inpatient Hospital Stay (HOSPITAL_COMMUNITY)

## 2017-06-12 HISTORY — PX: IR PARACENTESIS: IMG2679

## 2017-06-12 LAB — CBC
HEMATOCRIT: 33.5 % — AB (ref 36.0–46.0)
Hemoglobin: 10.8 g/dL — ABNORMAL LOW (ref 12.0–15.0)
MCH: 28.9 pg (ref 26.0–34.0)
MCHC: 32.2 g/dL (ref 30.0–36.0)
MCV: 89.6 fL (ref 78.0–100.0)
PLATELETS: 295 10*3/uL (ref 150–400)
RBC: 3.74 MIL/uL — ABNORMAL LOW (ref 3.87–5.11)
RDW: 13.5 % (ref 11.5–15.5)
WBC: 8.2 10*3/uL (ref 4.0–10.5)

## 2017-06-12 LAB — BASIC METABOLIC PANEL
ANION GAP: 5 (ref 5–15)
BUN: 11 mg/dL (ref 6–20)
CALCIUM: 8.5 mg/dL — AB (ref 8.9–10.3)
CO2: 27 mmol/L (ref 22–32)
Chloride: 104 mmol/L (ref 101–111)
Creatinine, Ser: 0.76 mg/dL (ref 0.44–1.00)
Glucose, Bld: 97 mg/dL (ref 65–99)
Potassium: 4.5 mmol/L (ref 3.5–5.1)
Sodium: 136 mmol/L (ref 135–145)

## 2017-06-12 LAB — BODY FLUID CELL COUNT WITH DIFFERENTIAL
Eos, Fluid: NONE SEEN %
Lymphs, Fluid: 63 %
Monocyte-Macrophage-Serous Fluid: 35 % — ABNORMAL LOW (ref 50–90)
Neutrophil Count, Fluid: 2 % (ref 0–25)
WBC FLUID: 840 uL (ref 0–1000)

## 2017-06-12 LAB — AMYLASE, PLEURAL OR PERITONEAL FLUID: AMYLASE FL: 18 U/L

## 2017-06-12 LAB — HIV ANTIBODY (ROUTINE TESTING W REFLEX): HIV SCREEN 4TH GENERATION: NONREACTIVE

## 2017-06-12 LAB — GRAM STAIN

## 2017-06-12 LAB — PROTEIN, PLEURAL OR PERITONEAL FLUID: TOTAL PROTEIN, FLUID: 5 g/dL

## 2017-06-12 MED ORDER — OXYCODONE HCL 5 MG PO TABS
15.0000 mg | ORAL_TABLET | ORAL | Status: DC | PRN
  Administered 2017-06-12 – 2017-06-13 (×2): 15 mg via ORAL
  Filled 2017-06-12 (×2): qty 3

## 2017-06-12 MED ORDER — LIDOCAINE HCL (PF) 1 % IJ SOLN
INTRAMUSCULAR | Status: AC
  Administered 2017-06-12: 11:00:00
  Filled 2017-06-12: qty 30

## 2017-06-12 MED ORDER — OXYCODONE HCL 5 MG PO TABS
5.0000 mg | ORAL_TABLET | ORAL | Status: DC | PRN
  Filled 2017-06-12: qty 1

## 2017-06-12 MED ORDER — OXYCODONE HCL 5 MG PO TABS
10.0000 mg | ORAL_TABLET | ORAL | Status: DC | PRN
  Administered 2017-06-12: 10 mg via ORAL
  Filled 2017-06-12: qty 2

## 2017-06-12 MED ORDER — MORPHINE SULFATE (PF) 4 MG/ML IV SOLN
4.0000 mg | INTRAVENOUS | Status: DC | PRN
  Administered 2017-06-12: 4 mg via INTRAVENOUS
  Filled 2017-06-12: qty 1
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
## 2017-06-12 NOTE — Progress Notes (Signed)
PROGRESS NOTE  SARI COGAN  ION:[id number] DOB: [birth year] DOA: 06/11/2017 PCP: Hendricks Limes, MD   Brief Narrative: Sierra Beck is a 61 y.o. female with a history of HTN, HLD, back pain and tobacco use who prsented to the ED with a week of acutely worsening, constant, severe generalized abdominal pain associated with nausea and vomiting. Abdominal pain first started a few months ago. CT abdomen/pelvis showed peritoneal carcinomatosis, 4.4cm in distal body and tail of the pancreas, moderate ascites, and hypodense masses in the right hepatic lobe. IR was consulted for paracentesis and biopsy, though they opted to follow cytology of paracentesis before considering biopsy.   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 IR prefers to follow cytology prior to attempting biopsy. - Oncology consulted.   Nausea, vomiting, abdominal pain: Suspect related to intraabdominal masses and ascites.  - OxyIR prn moderate pain, IV morphine prn severe pain, also can take NSAID.  - Continue antiemetic - Clear liquid diet, advance as tolerated.   HTN: Chronic, stable - Continue home medications.  DVT prophylaxis: SCDs Code Status: Full Family Communication: None at bedside Disposition Plan: Anticipate DC home once pain is controlled, currently requiring IV pain medications.   Consultants:   GI, [facility]  Oncology, Mohamed  Procedures:   Paracentesis 9/19  Antimicrobials:  None   Subjective: Abdominal pain is severe, generalized, improved slightly with paracentesis. Still 4mg  morphine not helping.   Objective: Vitals:   06/11/17 2200 06/11/17 2254 06/12/17 0608 06/12/17 1351  BP: 113/65 135/65 130/71 132/75  Pulse: 65 67 86 85  Resp:  18 19 19     Temp:  98.1 F (36.7 C) 98.2 F (36.8 C) 98.3 F (36.8 C)  TempSrc:  Oral Oral Oral  SpO2:  98% 98% 99%    Intake/Output Summary (Last 24 hours) at 06/12/17 1535 Last data filed at 06/12/17 1007  Gross per 24 hour  Intake              240 ml  Output                0 ml  Net              240 ml    Gen: 61 y.o. female in apparent pain. Pulm: Non-labored breathing room air. Clear to auscultation bilaterally.  CV: Regular rate and rhythm. No murmur, rub, or gallop. No JVD, no pedal edema. GI: Abdomen soft, diffusely tender to palpation without rebound or guarding, non-distended, +BS. Ext: Warm, no deformities Skin: No rashes, lesions no ulcers Neuro: Alert and oriented. No focal neurological deficits. Psych: Judgement and insight appear normal. Mood & affect appropriate.   Data Reviewed: I have personally reviewed following labs and imaging studies  CBC:  Recent Labs Lab 06/11/17 1426 06/12/17 0439  WBC 9.8 8.2  HGB 11.8* 10.8*  HCT 36.7 33.5*  MCV 89.5 89.6  PLT 345 244   Basic Metabolic Panel:  Recent Labs Lab 06/11/17 1426 06/12/17 0439  NA 136 136  K 3.3* 4.5  CL 99* 104  CO2 26 27  GLUCOSE 127* 97  BUN 12 11  CREATININE 0.71 0.76  CALCIUM 8.9 8.5*   GFR: CrCl cannot be calculated (Unknown ideal weight.). Liver Function Tests:  Recent Labs Lab 06/11/17 1426  AST 23  ALT 13*  ALKPHOS 133*  BILITOT 0.4  PROT 6.7  ALBUMIN 3.2*    Recent Labs Lab 06/11/17 1426  LIPASE 24   No results for input(s): AMMONIA in the last 168 hours. Coagulation Profile: No results for input(s): INR, PROTIME in the last 168 hours. Cardiac Enzymes: No results for input(s): CKTOTAL, CKMB, CKMBINDEX, TROPONINI in the last 168 hours. BNP (last 3 results) No results for input(s): PROBNP in the last 8760 hours. HbA1C: No results for input(s): HGBA1C in the last 72 hours. CBG: No results for input(s): GLUCAP in the last 168 hours. Lipid Profile: No results for  input(s): CHOL, HDL, LDLCALC, TRIG, CHOLHDL, LDLDIRECT in the last 72 hours. Thyroid Function Tests: No results for input(s): TSH, T4TOTAL, FREET4, T3FREE, THYROIDAB in the last 72 hours. Anemia Panel: No results for input(s): VITAMINB12, FOLATE, FERRITIN, TIBC, IRON, RETICCTPCT in the last 72 hours. Urine analysis:    Component Value Date/Time   COLORURINE AMBER (A) 06/11/2017 1830   APPEARANCEUR HAZY (A) 06/11/2017 1830   LABSPEC 1.023 06/11/2017 1830   PHURINE 6.0 06/11/2017 1830   GLUCOSEU NEGATIVE 06/11/2017 1830   HGBUR NEGATIVE 06/11/2017 1830   BILIRUBINUR NEGATIVE 06/11/2017 1830   KETONESUR NEGATIVE 06/11/2017 1830   PROTEINUR 100 (A) 06/11/2017 1830   UROBILINOGEN 0.2 03/08/2013 1333   NITRITE NEGATIVE 06/11/2017 1830   LEUKOCYTESUR NEGATIVE 06/11/2017 1830   No results found for this or any previous visit (from the past 240 hour(s)).    Radiology Studies: Ct Abdomen Pelvis W Contrast  Result Date: 06/11/2017 CLINICAL DATA:  Abdomen pain with nausea and vomiting EXAM: CT ABDOMEN AND PELVIS WITH CONTRAST TECHNIQUE: Multidetector CT imaging of the abdomen and pelvis was performed using the standard protocol following bolus administration of intravenous contrast. CONTRAST:  100 mL Isovue-300 intravenous COMPARISON:  None. FINDINGS: Lower chest: Trace right pleural effusion. Bandlike atelectasis in the right lower lobe. No focal consolidation. Heart size within normal limits Hepatobiliary: Vague hypodense lesions are suspected within the right hepatic lobe, measuring 2.9 cm and 2.4 cm. No calcified gallstones. No biliary dilatation. Pancreas: Ill-defined mass in the distal body of the pancreas measuring approximately 3.1 x 4.4 cm. Truncated appearance of pancreatic tail which may be secondary to marked atrophy. Spleen: Small fluid along the posterior, medial aspect of spleen. No definite focal abnormality Adrenals/Urinary Tract: Bilateral adrenal gland nodules, measuring 16 mm on the  right and 14 mm on the left. Kidneys show no hydronephrosis. The bladder is normal Stomach/Bowel: Stomach is nonenlarged. No dilated small bowel to suggest obstruction. No colon wall thickening. Appendix not well identified. Vascular/Lymphatic: Aortic atherosclerosis. No aneurysmal dilatation. Multiple small gastrohepatic lymph nodes. Multiple small central mesenteric lymph nodes. Reproductive: Status post hysterectomy. No adnexal masses. Other: Moderate ascites within the abdomen and pelvis.Diffuse nodular infiltration of the omentum and anterior mesentery with large plaque-like mass along the omentum. Musculoskeletal: No acute or suspicious bone lesion IMPRESSION: 1. Moderate ascites within the abdomen and pelvis. 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 2. Ill-defined approximate 4.4 cm hypodense mass within the distal body and tail of the pancreas, suspect for neoplasm 3. Bilateral adrenal gland nodules, indeterminate but suspicious given the peritoneal findings. 4. Suspected hypodense masses within the liver, cannot exclude metastatic foci. Nonemergent liver MRI may be helpful for further evaluation if/ when patient is able to breath hold adequately. 5. Trace right pleural  effusion. Electronically Signed   By: Donavan Foil M.D.   On: 06/11/2017 20:06    Scheduled Meds: . diphenhydrAMINE  50 mg Oral QHS   And  . acetaminophen  1,000 mg Oral QHS  . aspirin EC  81 mg Oral Daily  . bisoprolol-hydrochlorothiazide  1 tablet Oral Daily  . levothyroxine  88 mcg Oral QAC breakfast  . pantoprazole  20 mg Oral Daily  . pramipexole  0.125 mg Oral QHS  . rosuvastatin  10 mg Oral QPM   Continuous Infusions: . sodium chloride 75 mL/hr at 06/12/17 1410     LOS: 1 day   Time spent: 25 minutes.  Vance Gather, MD [facility] Pager  [phone]  If 7PM-7AM, please contact night-coverage [email or web address] Password [email or web address] 06/12/2017, 3:35 PM

## 2017-06-12 NOTE — Procedures (Signed)
PROCEDURE SUMMARY:  Successful US guided paracentesis from RLQ.  Yielded 2.9L of hazy yellow fluid.  No immediate complications.  Pt tolerated well.   Specimen was sent for labs.  [name] PA-C 06/12/2017 11:33 AM

## 2017-06-12 NOTE — Progress Notes (Signed)
IR asked to eval for biopsy in this pt who has just been admitted with pancreatic mass and other imaging findings concerning for metastatic process. Pt also c/o abd fullness secondary to associated ascites. Imaging and case reviewed. Pancreatic lesion, omental and peritoneal nodules consistent with carcinomatosis. Liver lesion suspicious for met. Ascites, likely malignant.  Given presence of significant ascites, liver or peritoneal/omental biopsy would be high risk for bleeding. Recommend proceeding with paracentesis(already performed). If cytology is diagnostic, then could avoid further biopsy. However, if cytology is not diagnostic, then could schedule or proceed with image guided core tissue biopsy, either of liver or peritoneal nodule.  Discussed with pt who understands.  [name] PA-C Interventional Radiology 06/12/2017 11:40 AM

## 2017-06-13 ENCOUNTER — Encounter (HOSPITAL_COMMUNITY): Payer: Self-pay | Admitting: General Practice

## 2017-06-13 DIAGNOSIS — Z515 Encounter for palliative care: Secondary | ICD-10-CM

## 2017-06-13 DIAGNOSIS — C787 Secondary malignant neoplasm of liver and intrahepatic bile duct: Secondary | ICD-10-CM

## 2017-06-13 DIAGNOSIS — K5909 Other constipation: Secondary | ICD-10-CM

## 2017-06-13 DIAGNOSIS — K869 Disease of pancreas, unspecified: Secondary | ICD-10-CM

## 2017-06-13 DIAGNOSIS — R188 Other ascites: Secondary | ICD-10-CM

## 2017-06-13 DIAGNOSIS — R630 Anorexia: Secondary | ICD-10-CM

## 2017-06-13 DIAGNOSIS — K5900 Constipation, unspecified: Secondary | ICD-10-CM

## 2017-06-13 DIAGNOSIS — G893 Neoplasm related pain (acute) (chronic): Secondary | ICD-10-CM

## 2017-06-13 DIAGNOSIS — E119 Type 2 diabetes mellitus without complications: Secondary | ICD-10-CM

## 2017-06-13 DIAGNOSIS — R634 Abnormal weight loss: Secondary | ICD-10-CM

## 2017-06-13 DIAGNOSIS — R1084 Generalized abdominal pain: Secondary | ICD-10-CM

## 2017-06-13 LAB — CANCER ANTIGEN 19-9: CA 19-9: 321576 U/mL — ABNORMAL HIGH (ref 0–35)

## 2017-06-13 MED ORDER — SENNA 8.6 MG PO TABS: 1.0000 | ORAL_TABLET | Freq: Two times a day (BID) | ORAL | Status: DC

## 2017-06-13 MED ORDER — ONDANSETRON HCL 4 MG/2ML IJ SOLN
4.0000 mg | Freq: Four times a day (QID) | INTRAMUSCULAR | Status: DC | PRN
  Administered 2017-06-13: 4 mg via INTRAVENOUS
  Filled 2017-06-13: qty 2

## 2017-06-13 MED ORDER — BISACODYL 10 MG RE SUPP
10.0000 mg | RECTAL | Status: AC
  Administered 2017-06-13: 10 mg via RECTAL
  Filled 2017-06-13: qty 1

## 2017-06-13 MED ORDER — ONDANSETRON HCL 4 MG/2ML IJ SOLN
4.0000 mg | Freq: Four times a day (QID) | INTRAMUSCULAR | Status: AC
  Administered 2017-06-13 – 2017-06-14 (×2): 4 mg via INTRAVENOUS
  Filled 2017-06-13 (×2): qty 2

## 2017-06-13 MED ORDER — HYDROMORPHONE 1 MG/ML IV SOLN
INTRAVENOUS | Status: DC
  Administered 2017-06-13: 2 mg via INTRAVENOUS
  Administered 2017-06-13: 1.5 mg via INTRAVENOUS
  Administered 2017-06-13: 15:00:00 via INTRAVENOUS
  Administered 2017-06-14: 1 mg via INTRAVENOUS
  Administered 2017-06-14: 4.94 mg via INTRAVENOUS
  Administered 2017-06-14: 0.908 mg via INTRAVENOUS
  Administered 2017-06-14: 1.36 mg via INTRAVENOUS
  Administered 2017-06-14: 2.5 mg via INTRAVENOUS
  Administered 2017-06-15: 2.55 mg via INTRAVENOUS
  Administered 2017-06-15: 2.9 mg via INTRAVENOUS
  Administered 2017-06-15: 07:00:00 via INTRAVENOUS
  Administered 2017-06-15: 2 mg via INTRAVENOUS
  Administered 2017-06-15: 2.26 mg via INTRAVENOUS
  Administered 2017-06-15: 1 mg via INTRAVENOUS
  Administered 2017-06-16: 2.06 mg via INTRAVENOUS
  Administered 2017-06-16: 2.02 mg via INTRAVENOUS
  Administered 2017-06-16: 1.6 mg via INTRAVENOUS
  Administered 2017-06-16: 2.37 mg via INTRAVENOUS
  Filled 2017-06-13 (×2): qty 25

## 2017-06-13 MED ORDER — LORAZEPAM 0.5 MG PO TABS
0.5000 mg | ORAL_TABLET | Freq: Four times a day (QID) | ORAL | Status: DC | PRN
  Administered 2017-06-13 – 2017-06-18 (×2): 0.5 mg via SUBLINGUAL
  Filled 2017-06-13 (×2): qty 1

## 2017-06-13 MED ORDER — NALOXONE HCL 0.4 MG/ML IJ SOLN: 0.4000 mg | INTRAMUSCULAR | Status: DC | PRN

## 2017-06-13 MED ORDER — DIPHENHYDRAMINE HCL 50 MG/ML IJ SOLN: 12.5000 mg | Freq: Four times a day (QID) | INTRAMUSCULAR | Status: DC | PRN

## 2017-06-13 MED ORDER — NICOTINE 21 MG/24HR TD PT24
21.0000 mg | MEDICATED_PATCH | Freq: Every day | TRANSDERMAL | Status: DC
  Administered 2017-06-13 – 2017-06-19 (×7): 21 mg via TRANSDERMAL
  Filled 2017-06-13 (×7): qty 1

## 2017-06-13 MED ORDER — SENNA 8.6 MG PO TABS: 2.0000 | ORAL_TABLET | Freq: Every day | ORAL | Status: DC

## 2017-06-13 MED ORDER — SODIUM CHLORIDE 0.9 % IV SOLN
8.0000 mg | Freq: Four times a day (QID) | INTRAVENOUS | Status: DC | PRN
  Administered 2017-06-16: 8 mg via INTRAVENOUS
  Filled 2017-06-13 (×2): qty 4

## 2017-06-13 MED ORDER — MORPHINE SULFATE (PF) 4 MG/ML IV SOLN
4.0000 mg | INTRAVENOUS | Status: AC
  Administered 2017-06-13: 4 mg via INTRAVENOUS
  Filled 2017-06-13: qty 1

## 2017-06-13 MED ORDER — DEXAMETHASONE SODIUM PHOSPHATE 4 MG/ML IJ SOLN
4.0000 mg | Freq: Two times a day (BID) | INTRAMUSCULAR | Status: DC
  Administered 2017-06-13 – 2017-06-17 (×9): 4 mg via INTRAVENOUS
  Filled 2017-06-13 (×9): qty 1

## 2017-06-13 MED ORDER — POLYETHYLENE GLYCOL 3350 17 G PO PACK
17.0000 g | PACK | Freq: Two times a day (BID) | ORAL | Status: AC
  Filled 2017-06-13: qty 1

## 2017-06-13 MED ORDER — HYDROMORPHONE HCL 1 MG/ML IJ SOLN
1.0000 mg | INTRAMUSCULAR | Status: AC
  Administered 2017-06-13: 1 mg via INTRAVENOUS
  Filled 2017-06-13: qty 1

## 2017-06-13 MED ORDER — DIPHENHYDRAMINE HCL 12.5 MG/5ML PO ELIX: 12.5000 mg | ORAL_SOLUTION | Freq: Four times a day (QID) | ORAL | Status: DC | PRN

## 2017-06-13 MED ORDER — SENNA 8.6 MG PO TABS
1.0000 | ORAL_TABLET | Freq: Two times a day (BID) | ORAL | Status: DC
  Filled 2017-06-13: qty 1

## 2017-06-13 MED ORDER — SODIUM CHLORIDE 0.9% FLUSH: 9.0000 mL | INTRAVENOUS | Status: DC | PRN
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
## 2017-06-13 NOTE — Progress Notes (Addendum)
PROGRESS NOTE  Sierra Beck  NAT:[id number] DOB: Aug 31, 1956 DOA: 06/11/2017 PCP: Berkley Harvey, NP   Brief Narrative: Sierra Beck is a 61 y.o. female with a history of HTN, HLD, back pain and tobacco use who prsented to the ED with a week of acutely worsening, constant, severe generalized abdominal pain associated with nausea and vomiting. Abdominal pain first started a few months ago. CT abdomen/pelvis showed peritoneal carcinomatosis, 4.4cm in distal body and tail of the pancreas, moderate ascites, and hypodense masses in the right hepatic lobe. IR was consulted for paracentesis and biopsy, though they opted to follow cytology of paracentesis before considering biopsy.   Assessment & Plan: Principal Problem:   Metastatic cancer ([place]) Active Problems:   Essential hypertension   Peritoneal carcinomatosis ([facility])   Nausea & vomiting   Abdominal pain  Peritoneal carcinomatosis, suspect metastatic pancreatic cancer: CA 19-9 grossly elevated at 321,576.  - Paracentesis 9/19, follow up cytology. IR prefers to follow cytology prior to attempting biopsy. - Oncology consulted, will arrange GI clinic follow up.   Nausea, vomiting, abdominal pain: Suspect related to intraabdominal masses and ascites.  - Palliative care consulted for symptom management: Start PCA. Can also take NSAID.  - Continue antiemetics - Diet as tolerated  HTN: Chronic, stable - Continue home medications.  DVT prophylaxis: SCDs Code Status: Full Family Communication: None at bedside Disposition Plan: Anticipate DC home once pain is controlled, currently requiring IV pain medications.   Consultants:   [facility], [place]  Procedures:   Paracentesis 9/19  Antimicrobials:  None   Subjective: Abdominal pain is severe, though she has not been getting too many prn's. Had emesis after crackers, and has not wanted to eat liquids since.  Objective: BP (!) 106/54 (BP Location: Right Arm)   Pulse (!) 58    Temp 97.7 F (36.5 C) (Oral)   Resp 13   SpO2 95%   Gen: 61 y.o. female in apparent pain. Pulm: Non-labored breathing room air. Clear to auscultation bilaterally.  CV: Regular rate and rhythm. No murmur, rub, or gallop. No JVD, no pedal edema. GI: Abdomen soft, diffusely tender to palpation without rebound or guarding, not distended, +BS. Ext: Warm, no deformities Skin: No rashes, lesions no ulcers Neuro: Alert and oriented. No focal neurological deficits. Psych: Judgement and insight appear normal. Mood & affect appropriate.   CBC:  Recent Labs Lab 06/11/17 1426 06/12/17 0439  WBC 9.8 8.2  HGB 11.8* 10.8*  HCT 36.7 33.5*  MCV 89.5 89.6  PLT 345 427   Basic Metabolic Panel:  Recent Labs Lab 06/11/17 1426 06/12/17 0439  NA 136 136  K 3.3* 4.5  CL 99* 104  CO2 26 27  GLUCOSE 127* 97  BUN 12 11  CREATININE 0.71 0.76  CALCIUM 8.9 8.5*   Liver Function Tests:  Recent Labs Lab 06/11/17 1426  AST 23  ALT 13*  ALKPHOS 133*  BILITOT 0.4  PROT 6.7  ALBUMIN 3.2*    Recent Labs Lab 06/11/17 1426  LIPASE 24   Urine analysis:    Component Value Date/Time   COLORURINE AMBER (A) 06/11/2017 1830   APPEARANCEUR HAZY (A) 06/11/2017 1830   LABSPEC 1.023 06/11/2017 1830   PHURINE 6.0 06/11/2017 1830   GLUCOSEU NEGATIVE 06/11/2017 1830   HGBUR NEGATIVE 06/11/2017 1830   BILIRUBINUR NEGATIVE 06/11/2017 1830   KETONESUR NEGATIVE 06/11/2017 1830   PROTEINUR 100 (A) 06/11/2017 1830   UROBILINOGEN 0.2 03/08/2013 1333   NITRITE NEGATIVE 06/11/2017 1830  LEUKOCYTESUR NEGATIVE 06/11/2017 1830   Recent Results (from the past 240 hour(s))  Culture, body fluid-bottle     Status: None (Preliminary result)   Collection Time: 06/12/17 11:48 AM  Result Value Ref Range Status   Specimen Description FLUID PERITONEAL  Final   Special Requests NONE  Final   Culture NO GROWTH 1 DAY  Final   Report Status PENDING  Incomplete  Gram stain     Status: None   Collection Time:  06/12/17 11:48 AM  Result Value Ref Range Status   Specimen Description FLUID PERITONEAL  Final   Special Requests NONE  Final   Gram Stain   Final    ABUNDANT WBC PRESENT, PREDOMINANTLY MONONUCLEAR NO ORGANISMS SEEN    Report Status 06/12/2017 FINAL  Final      Radiology Studies: Ct Abdomen Pelvis W Contrast  Result Date: 06/11/2017 CLINICAL DATA:  Abdomen pain with nausea and vomiting EXAM: CT ABDOMEN AND PELVIS WITH CONTRAST TECHNIQUE: Multidetector CT imaging of the abdomen and pelvis was performed using the standard protocol following bolus administration of intravenous contrast. CONTRAST:  100 mL Isovue-300 intravenous COMPARISON:  None. FINDINGS: Lower chest: Trace right pleural effusion. Bandlike atelectasis in the right lower lobe. No focal consolidation. Heart size within normal limits Hepatobiliary: Vague hypodense lesions are suspected within the right hepatic lobe, measuring 2.9 cm and 2.4 cm. No calcified gallstones. No biliary dilatation. Pancreas: Ill-defined mass in the distal body of the pancreas measuring approximately 3.1 x 4.4 cm. Truncated appearance of pancreatic tail which may be secondary to marked atrophy. Spleen: Small fluid along the posterior, medial aspect of spleen. No definite focal abnormality Adrenals/Urinary Tract: Bilateral adrenal gland nodules, measuring 16 mm on the right and 14 mm on the left. Kidneys show no hydronephrosis. The bladder is normal Stomach/Bowel: Stomach is nonenlarged. No dilated small bowel to suggest obstruction. No colon wall thickening. Appendix not well identified. Vascular/Lymphatic: Aortic atherosclerosis. No aneurysmal dilatation. Multiple small gastrohepatic lymph nodes. Multiple small central mesenteric lymph nodes. Reproductive: Status post hysterectomy. No adnexal masses. Other: Moderate ascites within the abdomen and pelvis.Diffuse nodular infiltration of the omentum and anterior mesentery with large plaque-like mass along the  omentum. Musculoskeletal: No acute or suspicious bone lesion IMPRESSION: 1. Moderate ascites within the abdomen and pelvis. 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 2. Ill-defined approximate 4.4 cm hypodense mass within the distal body and tail of the pancreas, suspect for neoplasm 3. Bilateral adrenal gland nodules, indeterminate but suspicious given the peritoneal findings. 4. Suspected hypodense masses within the liver, cannot exclude metastatic foci. Nonemergent liver MRI may be helpful for further evaluation if/ when patient is able to breath hold adequately. 5. Trace right pleural effusion. Electronically Signed   By: Donavan Foil M.D.   On: 06/11/2017 20:06   Ir Paracentesis  Result Date: 06/12/2017 INDICATION: Pancreatic mass. Peritoneal nodules. Ascites. Request diagnostic and therapeutic paracentesis. EXAM: ULTRASOUND GUIDED RIGHT LOWER QUADRANT PARACENTESIS MEDICATIONS: None. COMPLICATIONS: None immediate. PROCEDURE: Informed written consent was obtained from the patient after a discussion of the risks, benefits and alternatives to treatment. A timeout was performed prior to the initiation of the procedure. Initial ultrasound scanning demonstrates a large amount of ascites within the right lower abdominal quadrant. The right lower abdomen was prepped and draped in the usual sterile fashion. 1% lidocaine with epinephrine was used for local anesthesia. Following this, a 19 gauge,  7-cm, Yueh catheter was introduced. An ultrasound image was saved for documentation purposes. The paracentesis was performed. The catheter was removed and a dressing was applied. The patient tolerated the procedure well without immediate post procedural complication. FINDINGS: A total of approximately 2.9 L of hazy yellow fluid was removed. Samples were sent to  the laboratory as requested by the clinical team. IMPRESSION: Successful ultrasound-guided paracentesis yielding 2.9 liters of peritoneal fluid. Read by: [name] PA-C Electronically Signed   By: Aletta Edouard M.D.   On: 06/12/2017 16:33    Scheduled Meds: . diphenhydrAMINE  50 mg Oral QHS   And  . acetaminophen  1,000 mg Oral QHS  . aspirin EC  81 mg Oral Daily  . bisoprolol-hydrochlorothiazide  1 tablet Oral Daily  . dexamethasone  4 mg Intravenous Q12H  . HYDROmorphone   Intravenous Q4H  . levothyroxine  88 mcg Oral QAC breakfast  . nicotine  21 mg Transdermal Daily  . pantoprazole  20 mg Oral Daily  . polyethylene glycol  17 g Oral BID  . pramipexole  0.125 mg Oral QHS  . rosuvastatin  10 mg Oral QPM  . [START ON 06/14/2017] senna  1 tablet Oral BID   Continuous Infusions: . sodium chloride 75 mL/hr at 06/12/17 1410     LOS: 2 days   Time spent: 25 minutes.  Vance Gather, MD [facility] Pager [phone]  If 7PM-7AM, please contact night-coverage [email or web address] Password [email or web address] 06/13/2017, 3:27 PM

## 2017-06-13 NOTE — Progress Notes (Signed)
[facility] PROGRESS NOTE Subjective  the patient is still somewhat nausea related and having trouble down. Cytology results still pending CA 19 9 pending  Objective: Vital signs in last 24 hours: Temp:  [98.3 F (36.8 C)] 98.3 F (36.8 C) (09/19 1351) Pulse Rate:  [85] 85 (09/19 1351) Resp:  [19] 19 (09/19 1351) BP: (132)/(75) 132/75 (09/19 1351) SpO2:  [99 %] 99 % (09/19 1351)    Intake/Output from previous day: 09/19 0701 - 09/20 0700 In: 480 [P.O.:480] Out: -  Intake/Output this shift: No intake/output data recorded.  PE: General--white female alert and oriented multiple questions  Abdomen--nondistended soft and nontender  Lab Results:  Recent Labs  06/11/17 1426 06/12/17 0439  WBC 9.8 8.2  HGB 11.8* 10.8*  HCT 36.7 33.5*  PLT 345 295   BMET  Recent Labs  06/11/17 1426 06/12/17 0439  NA 136 136  K 3.3* 4.5  CL 99* 104  CO2 26 27  CREATININE 0.71 0.76   LFT  Recent Labs  06/11/17 1426  PROT 6.7  AST 23  ALT 13*  ALKPHOS 133*  BILITOT 0.4   PT/INR No results for input(s): LABPROT, INR in the last 72 hours. PANCREAS  Recent Labs  06/11/17 1426  LIPASE 24         Studies/Results: Ct Abdomen Pelvis W Contrast  Result Date: 06/11/2017 CLINICAL DATA:  Abdomen pain with nausea and vomiting EXAM: CT ABDOMEN AND PELVIS WITH CONTRAST TECHNIQUE: Multidetector CT imaging of the abdomen and pelvis was performed using the standard protocol following bolus administration of intravenous contrast. CONTRAST:  100 mL Isovue-300 intravenous COMPARISON:  None. FINDINGS: Lower chest: Trace right pleural effusion. Bandlike atelectasis in the right lower lobe. No focal consolidation. Heart size within normal limits Hepatobiliary: Vague hypodense lesions are suspected within the right hepatic lobe, measuring 2.9 cm and 2.4 cm. No calcified gallstones. No biliary dilatation. Pancreas: Ill-defined mass in the distal body of the pancreas measuring  approximately 3.1 x 4.4 cm. Truncated appearance of pancreatic tail which may be secondary to marked atrophy. Spleen: Small fluid along the posterior, medial aspect of spleen. No definite focal abnormality Adrenals/Urinary Tract: Bilateral adrenal gland nodules, measuring 16 mm on the right and 14 mm on the left. Kidneys show no hydronephrosis. The bladder is normal Stomach/Bowel: Stomach is nonenlarged. No dilated small bowel to suggest obstruction. No colon wall thickening. Appendix not well identified. Vascular/Lymphatic: Aortic atherosclerosis. No aneurysmal dilatation. Multiple small gastrohepatic lymph nodes. Multiple small central mesenteric lymph nodes. Reproductive: Status post hysterectomy. No adnexal masses. Other: Moderate ascites within the abdomen and pelvis.Diffuse nodular infiltration of the omentum and anterior mesentery with large plaque-like mass along the omentum. Musculoskeletal: No acute or suspicious bone lesion IMPRESSION: 1. Moderate ascites within the abdomen and pelvis. 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 2. Ill-defined approximate 4.4 cm hypodense mass within the distal body and tail of the pancreas, suspect for neoplasm 3. Bilateral adrenal gland nodules, indeterminate but suspicious given the peritoneal findings. 4. Suspected hypodense masses within the liver, cannot exclude metastatic foci. Nonemergent liver MRI may be helpful for further evaluation if/ when patient is able to breath hold adequately. 5. Trace right pleural effusion. Electronically Signed   By: Donavan Foil M.D.   On: 06/11/2017 20:06   Ir Paracentesis  Result Date: 06/12/2017 INDICATION: Pancreatic mass. Peritoneal nodules. Ascites. Request diagnostic and therapeutic paracentesis. EXAM:  ULTRASOUND GUIDED RIGHT LOWER QUADRANT PARACENTESIS  MEDICATIONS: None. COMPLICATIONS: None immediate. PROCEDURE: Informed written consent was obtained from the patient after a discussion of the risks, benefits and alternatives to treatment. A timeout was performed prior to the initiation of the procedure. Initial ultrasound scanning demonstrates a large amount of ascites within the right lower abdominal quadrant. The right lower abdomen was prepped and draped in the usual sterile fashion. 1% lidocaine with epinephrine was used for local anesthesia. Following this, a 19 gauge, 7-cm, Yueh catheter was introduced. An ultrasound image was saved for documentation purposes. The paracentesis was performed. The catheter was removed and a dressing was applied. The patient tolerated the procedure well without immediate post procedural complication. FINDINGS: A total of approximately 2.9 L of hazy yellow fluid was removed. Samples were sent to the laboratory as requested by the clinical team. IMPRESSION: Successful ultrasound-guided paracentesis yielding 2.9 liters of peritoneal fluid. Read by: [name] PA-C Electronically Signed   By: Aletta Edouard M.D.   On: 06/12/2017 16:33    Medications: I have reviewed the patient's current medications.  Assessment:   1. Pancreatic mass with evidence of liver Mets. Probably pancreatic cancer   Plan: 1. Waiting for results of cytology and CA 19 9.Have discussed with patient. Oncology apparently is on board. If cytology reveals malignancy and CA 19 9 elevated, doubt will need liver biopsy.   Avanni Turnbaugh JR,Tritia Endo L 06/13/2017, 8:42 AM  This note was created using voice recognition software. Minor errors may Have occurred unintentionally.  Pager: [phone] If no answer or after hours call [phone]

## 2017-06-14 ENCOUNTER — Inpatient Hospital Stay (HOSPITAL_COMMUNITY)

## 2017-06-14 DIAGNOSIS — K8689 Other specified diseases of pancreas: Secondary | ICD-10-CM

## 2017-06-14 LAB — LIPASE, FLUID: Lipase-Fluid: 7 U/L

## 2017-06-14 MED ORDER — PANTOPRAZOLE SODIUM 40 MG PO TBEC
40.0000 mg | DELAYED_RELEASE_TABLET | Freq: Every day | ORAL | Status: DC
  Administered 2017-06-14 – 2017-06-19 (×5): 40 mg via ORAL
  Filled 2017-06-14 (×6): qty 1

## 2017-06-14 MED ORDER — POLYETHYLENE GLYCOL 3350 17 G PO PACK
17.0000 g | PACK | Freq: Three times a day (TID) | ORAL | Status: DC
  Administered 2017-06-14 – 2017-06-16 (×4): 17 g via ORAL
  Filled 2017-06-14 (×6): qty 1

## 2017-06-14 MED ORDER — SENNA 8.6 MG PO TABS
2.0000 | ORAL_TABLET | Freq: Two times a day (BID) | ORAL | Status: DC
  Administered 2017-06-14 – 2017-06-19 (×8): 17.2 mg via ORAL
  Filled 2017-06-14 (×9): qty 2

## 2017-06-14 MED ORDER — HYDROMORPHONE BOLUS VIA INFUSION
0.5000 mg | INTRAVENOUS | Status: AC
  Administered 2017-06-14: 0.5 mg via INTRAVENOUS

## 2017-06-14 MED ORDER — METOCLOPRAMIDE HCL 5 MG/ML IJ SOLN
10.0000 mg | Freq: Three times a day (TID) | INTRAMUSCULAR | Status: AC
  Administered 2017-06-14 – 2017-06-16 (×6): 10 mg via INTRAVENOUS
  Filled 2017-06-14 (×6): qty 2

## 2017-06-14 MED ORDER — PANTOPRAZOLE SODIUM 20 MG PO TBEC: 20.0000 mg | DELAYED_RELEASE_TABLET | Freq: Every day | ORAL | Status: DC

## 2017-06-14 MED ORDER — METOCLOPRAMIDE HCL 5 MG/ML IJ SOLN: 10.0000 mg | Freq: Three times a day (TID) | INTRAMUSCULAR | Status: DC

## 2017-06-14 MED ORDER — FAMOTIDINE IN NACL 20-0.9 MG/50ML-% IV SOLN
20.0000 mg | Freq: Two times a day (BID) | INTRAVENOUS | Status: DC
  Filled 2017-06-14: qty 50

## 2017-06-14 MED ORDER — ALUM & MAG HYDROXIDE-SIMETH 200-200-20 MG/5ML PO SUSP
30.0000 mL | Freq: Four times a day (QID) | ORAL | Status: DC | PRN
Start: 2017-06-14 — End: 2017-06-14

## 2017-06-14 MED ORDER — SIMETHICONE 80 MG PO CHEW
80.0000 mg | CHEWABLE_TABLET | Freq: Four times a day (QID) | ORAL | Status: DC | PRN
  Administered 2017-06-14 – 2017-06-17 (×3): 80 mg via ORAL
  Filled 2017-06-14 (×3): qty 1

## 2017-06-14 MED ORDER — LACTULOSE ENEMA
300.0000 mL | Freq: Once | ORAL | Status: AC
  Administered 2017-06-14: 300 mL via RECTAL
  Filled 2017-06-14 (×2): qty 300

## 2017-06-14 NOTE — Progress Notes (Signed)
Patient ID: Sierra Beck, female   DOB: Mar 04, 1956, 61 y.o.   MRN: [id number]  IR aware of Bx request Approved by Dr Kathlene Cote  Dr Burr Medico note 9/20 -I have spoken with IR Dr. Kathlene Cote for tissue biopsy, he recommends peritoneal mass biopsy, hopefully will get it done tomorrow, if not, please arrange as outpt ASAP   IR Unable to perform bx today. IR PA has spoken to Dr Bonner Puna If pt is dc'd over weekend---will be arranged as OP asap If still inpt---will plan for Monday
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
## 2017-06-15 DIAGNOSIS — R1900 Intra-abdominal and pelvic swelling, mass and lump, unspecified site: Secondary | ICD-10-CM

## 2017-06-15 NOTE — Progress Notes (Signed)
Chart reviewed and staffed with RN. Pt feeling much better per chart evaluation. So much so she is ambulating in the halls and has gone outside with her family. Dilaudid PCA: 2 boluses for 1mg  total in 24hrs. Pt has had 4 BM's and refused miralax this am. No n/v. Eating small amts. Seen by IR and plan for bx on Mon. Palliative medicine to continue to assist in symptom mgt. Thank you, Romona Curls, ANP

## 2017-06-15 NOTE — Progress Notes (Signed)
Sierra Beck 1:17 PM  Subjective: Patient doing better after multiple bowel movements yesterday and it seems to be better today and she is eating some and her hospital computer chart was reviewed and her case discussed with my partner Dr. B and she has her biopsy scheduled on Monday which we discussed  Objective: Vital signs stable afebrile no acute distress abdomen slight protuberance nontender no new labs  Assessment: Metastatic ascites  Plan: Await biopsy please let us know if we can help and I pointed out our office building and gave her our phone number and we are happy to see back as an outpatient if we could be of any assistance  [facility]  Pager [phone] After 5PM or if no answer call [phone]

## 2017-06-15 NOTE — Progress Notes (Signed)
Dilaudid PCA syringe replaced. Wasted approx. 71ml PCA Dilaudid witnessed by Ara Kussmaul
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
## 2017-06-16 LAB — CBC
HEMATOCRIT: 34 % — AB (ref 36.0–46.0)
HEMOGLOBIN: 10.7 g/dL — AB (ref 12.0–15.0)
MCH: 28 pg (ref 26.0–34.0)
MCHC: 31.5 g/dL (ref 30.0–36.0)
MCV: 89 fL (ref 78.0–100.0)
Platelets: 302 10*3/uL (ref 150–400)
RBC: 3.82 MIL/uL — AB (ref 3.87–5.11)
RDW: 13.3 % (ref 11.5–15.5)
WBC: 8.3 10*3/uL (ref 4.0–10.5)

## 2017-06-16 LAB — APTT: aPTT: 30 seconds (ref 24–36)

## 2017-06-16 LAB — PROTIME-INR
INR: 1.23
Prothrombin Time: 15.4 seconds — ABNORMAL HIGH (ref 11.4–15.2)

## 2017-06-16 MED ORDER — POLYETHYLENE GLYCOL 3350 17 G PO PACK
17.0000 g | PACK | Freq: Two times a day (BID) | ORAL | Status: DC
  Administered 2017-06-16 – 2017-06-19 (×5): 17 g via ORAL
  Filled 2017-06-16 (×6): qty 1

## 2017-06-16 MED ORDER — HYDROMORPHONE 1 MG/ML IV SOLN
INTRAVENOUS | Status: AC
  Administered 2017-06-16: 3.04 mg via INTRAVENOUS
  Administered 2017-06-16: 5.7 mg via INTRAVENOUS
  Administered 2017-06-16: 16:00:00 via INTRAVENOUS
  Administered 2017-06-17: 6.27 mg via INTRAVENOUS
  Administered 2017-06-17: 4.08 mg via INTRAVENOUS
  Administered 2017-06-17: 21:00:00 via INTRAVENOUS
  Administered 2017-06-17: 1.5 mg via INTRAVENOUS
  Administered 2017-06-17: 5.62 mg via INTRAVENOUS
  Administered 2017-06-17: 1.5 mg via INTRAVENOUS
  Filled 2017-06-16 (×2): qty 25

## 2017-06-16 MED ORDER — BISACODYL 10 MG RE SUPP: 10.0000 mg | Freq: Every day | RECTAL | Status: DC | PRN

## 2017-06-16 NOTE — Progress Notes (Signed)
Daily Progress Note   Patient Name: Sierra Beck       Date: 06/16/2017 DOB: [birth year]  Age: 61 y.o. MRN#: [id number] Attending Physician: Tawni Millers Primary Care Physician: Berkley Harvey, NP Admit Date: 06/11/2017  Reason for Consultation/Follow-up: Non pain symptom management, Pain control and Psychosocial/spiritual support  Subjective: Patient overall verbalizing feeling much better in terms of her pain management but is noticing increased abdominal pain starting last night. There is been no vomiting. She is eating small amounts which is not precipitated pain or nausea and vomiting. Patient verbalizing trouble drinking MiraLAX 3 times a day secondary to texture and taste  Length of Stay: 5  Current Medications: Scheduled Meds:  . diphenhydrAMINE  50 mg Oral QHS   And  . acetaminophen  1,000 mg Oral QHS  . aspirin EC  81 mg Oral Daily  . bisoprolol-hydrochlorothiazide  1 tablet Oral Daily  . dexamethasone  4 mg Intravenous Q12H  . HYDROmorphone   Intravenous Q4H  . levothyroxine  88 mcg Oral QAC breakfast  . nicotine  21 mg Transdermal Daily  . pantoprazole  40 mg Oral Daily  . polyethylene glycol  17 g Oral BID  . pramipexole  0.125 mg Oral QHS  . rosuvastatin  10 mg Oral QPM  . senna  2 tablet Oral BID    Continuous Infusions: . sodium chloride 75 mL/hr at 06/16/17 0147  . ondansetron (ZOFRAN) IV      PRN Meds: acetaminophen **OR** acetaminophen, bisacodyl, diphenhydrAMINE **OR** diphenhydrAMINE, LORazepam, naloxone **AND** sodium chloride flush, naproxen, ondansetron (ZOFRAN) IV, simethicone  Physical Exam  Constitutional: She is oriented to person, place, and time. She appears well-developed and well-nourished.  Neck: Normal range of motion.    Cardiovascular: Normal rate.   Pulmonary/Chest: Effort normal.  Abdominal: Soft. She exhibits distension.  Musculoskeletal: Normal range of motion.  Neurological: She is alert and oriented to person, place, and time.  Skin: Skin is warm and dry.  Psychiatric: She has a normal mood and affect. Her behavior is normal. Judgment and thought content normal.  Nursing note and vitals reviewed.           Vital Signs: BP (!) 126/59 (BP Location: Right Arm)   Pulse 69   Temp 98.3 F (36.8 C) (Oral)   Resp (!) 8  Ht 5\' 2"  (1.575 m)   Wt 79.2 kg (174 lb 9.6 oz)   SpO2 96%   BMI 31.93 kg/m  SpO2: SpO2: 96 % O2 Device: O2 Device: Nasal Cannula O2 Flow Rate: O2 Flow Rate (L/min): 1 L/min  Intake/output summary:  Intake/Output Summary (Last 24 hours) at 06/16/17 1316 Last data filed at 06/16/17 0554  Gross per 24 hour  Intake          2416.25 ml  Output                0 ml  Net          2416.25 ml   LBM: Last BM Date: 06/14/17 Baseline Weight: Weight: 79.2 kg (174 lb 9.6 oz) Most recent weight: Weight: 79.2 kg (174 lb 9.6 oz)       Palliative Assessment/Data:      Patient Active Problem List   Diagnosis Date Noted  . Pancreatic mass   . Metastatic adenocarcinoma to liver ([place])   . Constipation   . Palliative care by specialist   . Abdominal pain, generalized   . Peritoneal carcinomatosis ([place]) 06/11/2017  . Metastatic cancer ([place]) 06/11/2017  . Nausea & vomiting 06/11/2017  . Abdominal pain 06/11/2017  . HYPERLIPIDEMIA 10/21/2007  . Unspecified hypothyroidism 02/24/2007  . HYPERLIPIDEMIA 02/24/2007  . Essential hypertension 02/24/2007    Palliative Care Assessment & Plan   Patient Profile: 61 y.o.femalewith past medical history of diabetes, hypertension, HLD, back painadmitted on 9/18/2018with worsening abdominal pain, nausea and vomiting. Workup revealed large pancreatic mass, peritoneal carcinomatosis, abdominal ascites, and liver mass per CT- labs show CA-19  elevated sig- at [phone], indicating likely metastatic pancreatic cancer. Patient admitted for symptom management and further workup. Palliative medicine consulted urgently for assistance with pain and nausea.   Recommendations/Plan:  Pain: Reviewed previous 24 hour opioid usage. Up titrate basal rate Dilaudid 0.75 mg an hour and up titrate bolus rate to 0.75 mg every 20 minutes with a lockout of 3 mg an hour.   Constipation: Improving but patient has not had a bowel movement today and is concerned. She reports having difficulty drinking MiraLAX 3 times a day because of texture. We'll decrease MiraLAX to twice daily and increase senna to 2 tablets twice daily. Continue Reglan 10 mg every 8 hours    Goals of Care and Additional Recommendations:  Limitations on Scope of Treatment: Full Scope Treatment  Code Status:    Code Status Orders        Start     Ordered   06/11/17 2104  Full code  Continuous     06/11/17 2106    Code Status History    Date Active Date Inactive Code Status Order ID Comments User Context   This patient has a current code status but no historical code status.       Prognosis:   Unable to determine  Discharge Planning:  Home with [place] was discussed with bedside RN, Remo Lipps  Thank you for allowing the Palliative Medicine Team to assist in the care of this patient.   Time In: 1255 Time Out: 1320 Total Time 25 min Prolonged Time Billed  no       Greater than 50%  of this time was spent counseling and coordinating care related to the above assessment and plan.  Dory Horn, NP  Please contact Palliative Medicine Team phone at [phone] for questions and concerns.

## 2017-06-16 NOTE — Progress Notes (Signed)
PROGRESS NOTE    Sierra Beck  JJO:[id number] DOB: 1956-07-04 DOA: 06/11/2017 PCP: Berkley Harvey, NP    Brief Narrative:  61 year old female who presented with abdominal pain. Patient does have a significant past medical history for hypertension, dyslipidemia and tobacco use. Patient develop acute worsening of chronic abdominal pain, associated with vomiting. On the initial physical examination, blood pressure 126/62, heart rate 66, respiratory rate 18, temperature 98, oxygen saturation 99%. Moist mucous membranes, lungs are clear to auscultation bilaterally, no wheezing rales or rhonchi, heart S1-S2 present rhythmic, no gallops or murmurs, the abdomen was soft nontender, no lower extremity edema. CT of the abdomen showed moderate ascites,nodular infiltration of the omentum and mesentery with large plate-like omental mass and numerous additional peritoneal nodules, ill-defined 4.4 cm hypodense mass within the distal body and tail of the pancreas, suspected hypodense masses within the liver, and bilateral adrenal gland nodules.   Patient was admitted to the hospital working diagnosis of abdominal pain due to metastatic disease with peritoneal carcinomatosis, primary likely pancreatic cancer. Further workup showed markedly elevated CA-19-9. Due to extensive disease palliative care was consulted, the patient was placed on hydromorphone PCA pump. Plan for peritoneal biopsy on September 24   Assessment & Plan:   Principal Problem:   Metastatic cancer [facility]) Active Problems:   Essential hypertension   Peritoneal carcinomatosis ([place])   Nausea & vomiting   Abdominal pain   Metastatic adenocarcinoma to liver [facility])   Constipation   Palliative care by specialist   Abdominal pain, generalized   Pancreatic mass   1. Peritoneal carcinomatosis with suspected pancreatic malignancy. Continue palliative pain control with hydromorphone per pca pump, will continue supportive medical care, plan is for biopsy  in am, no nausea or vomiting, out of bed as tolerated. Gentle hydration with saline IV. Continue dexamethasone.   2. Hypertension. Blood pressure well controlled, will continue to hold antihypertensive medications for now.   3. Anemia chronic disease. Stable hb and hct, will continue to follow cell count.   4. Hypothyroid. Continue levothyroxine.      DVT prophylaxis: enoxaparin  Code Status: full Family Communication:  Disposition Plan: Home   Consultants:   Oncology  IR  Palliative Care  Procedures:     Antimicrobials:       Subjective: Worsening abdominal pain, epigastric and radiated to the back, no associated nausea or vomiting, no chest pain or dyspnea, using pca pump with no significant pain improvement, positive somnolence.   Objective: Vitals:   06/16/17 0400 06/16/17 0553 06/16/17 0825 06/16/17 1134  BP:  (!) 126/59    Pulse:  69    Resp: 18 18 (!) 8 (!) 8  Temp:  98.3 F (36.8 C)    TempSrc:  Oral    SpO2: 98% 97% 97% 96%  Weight:    79.2 kg (174 lb 9.6 oz)  Height:    5\' 2"  (1.575 m)    Intake/Output Summary (Last 24 hours) at 06/16/17 1154 Last data filed at 06/16/17 0554  Gross per 24 hour  Intake          2536.25 ml  Output                0 ml  Net          2536.25 ml   Filed Weights   06/16/17 1134  Weight: 79.2 kg (174 lb 9.6 oz)    Examination:  General: Not in pain or dyspnea, deconditioned Neurology: Awake and alert, non focal  E ENT: mild pallor, no icterus, oral mucosa moist Cardiovascular: No JVD. S1-S2 present, rhythmic, no gallops, rubs, or murmurs. No lower extremity edema. Pulmonary: vesicular breath sounds bilaterally, adequate air movement, no wheezing, rhonchi or rales. Gastrointestinal. Abdomen with mild distention, no organomegaly, non tender, no rebound or guarding. Decreased bowel sounds.  Skin. No rashes Musculoskeletal: no joint deformities     Data Reviewed: I have personally reviewed following labs and  imaging studies  CBC:  Recent Labs Lab 06/11/17 1426 06/12/17 0439 06/16/17 0439  WBC 9.8 8.2 8.3  HGB 11.8* 10.8* 10.7*  HCT 36.7 33.5* 34.0*  MCV 89.5 89.6 89.0  PLT 345 295 160   Basic Metabolic Panel:  Recent Labs Lab 06/11/17 1426 06/12/17 0439  NA 136 136  K 3.3* 4.5  CL 99* 104  CO2 26 27  GLUCOSE 127* 97  BUN 12 11  CREATININE 0.71 0.76  CALCIUM 8.9 8.5*   GFR: Estimated Creatinine Clearance: 71 mL/min (by C-G formula based on SCr of 0.76 mg/dL). Liver Function Tests:  Recent Labs Lab 06/11/17 1426  AST 23  ALT 13*  ALKPHOS 133*  BILITOT 0.4  PROT 6.7  ALBUMIN 3.2*    Recent Labs Lab 06/11/17 1426  LIPASE 24   No results for input(s): AMMONIA in the last 168 hours. Coagulation Profile:  Recent Labs Lab 06/16/17 0439  INR 1.23   Cardiac Enzymes: No results for input(s): CKTOTAL, CKMB, CKMBINDEX, TROPONINI in the last 168 hours. BNP (last 3 results) No results for input(s): PROBNP in the last 8760 hours. HbA1C: No results for input(s): HGBA1C in the last 72 hours. CBG: No results for input(s): GLUCAP in the last 168 hours. Lipid Profile: No results for input(s): CHOL, HDL, LDLCALC, TRIG, CHOLHDL, LDLDIRECT in the last 72 hours. Thyroid Function Tests: No results for input(s): TSH, T4TOTAL, FREET4, T3FREE, THYROIDAB in the last 72 hours. Anemia Panel: No results for input(s): VITAMINB12, FOLATE, FERRITIN, TIBC, IRON, RETICCTPCT in the last 72 hours.    Radiology Studies: I have reviewed all of the imaging during this hospital visit personally     Scheduled Meds: . diphenhydrAMINE  50 mg Oral QHS   And  . acetaminophen  1,000 mg Oral QHS  . aspirin EC  81 mg Oral Daily  . bisoprolol-hydrochlorothiazide  1 tablet Oral Daily  . dexamethasone  4 mg Intravenous Q12H  . HYDROmorphone   Intravenous Q4H  . levothyroxine  88 mcg Oral QAC breakfast  . nicotine  21 mg Transdermal Daily  . pantoprazole  40 mg Oral Daily  .  polyethylene glycol  17 g Oral TID  . pramipexole  0.125 mg Oral QHS  . rosuvastatin  10 mg Oral QPM  . senna  2 tablet Oral BID   Continuous Infusions: . sodium chloride 75 mL/hr at 06/16/17 0147  . ondansetron (ZOFRAN) IV       LOS: 5 days        Suheyb Raucci Gerome Apley, MD [facility] Pager [phone]

## 2017-06-17 DIAGNOSIS — K5900 Constipation, unspecified: Secondary | ICD-10-CM

## 2017-06-17 LAB — CULTURE, BODY FLUID W GRAM STAIN -BOTTLE: Culture: NO GROWTH

## 2017-06-17 LAB — BASIC METABOLIC PANEL
ANION GAP: 6 (ref 5–15)
BUN: 7 mg/dL (ref 6–20)
CALCIUM: 8.3 mg/dL — AB (ref 8.9–10.3)
CO2: 28 mmol/L (ref 22–32)
Chloride: 102 mmol/L (ref 101–111)
Creatinine, Ser: 0.52 mg/dL (ref 0.44–1.00)
GLUCOSE: 160 mg/dL — AB (ref 65–99)
POTASSIUM: 3.8 mmol/L (ref 3.5–5.1)
SODIUM: 136 mmol/L (ref 135–145)

## 2017-06-17 LAB — CBC WITH DIFFERENTIAL/PLATELET
BASOS ABS: 0 10*3/uL (ref 0.0–0.1)
BASOS PCT: 0 %
EOS ABS: 0 10*3/uL (ref 0.0–0.7)
EOS PCT: 0 %
HCT: 34.6 % — ABNORMAL LOW (ref 36.0–46.0)
Hemoglobin: 10.8 g/dL — ABNORMAL LOW (ref 12.0–15.0)
LYMPHS PCT: 9 %
Lymphs Abs: 0.9 10*3/uL (ref 0.7–4.0)
MCH: 27.7 pg (ref 26.0–34.0)
MCHC: 31.2 g/dL (ref 30.0–36.0)
MCV: 88.7 fL (ref 78.0–100.0)
MONO ABS: 0.6 10*3/uL (ref 0.1–1.0)
Monocytes Relative: 6 %
Neutro Abs: 8.1 10*3/uL — ABNORMAL HIGH (ref 1.7–7.7)
Neutrophils Relative %: 85 %
PLATELETS: 285 10*3/uL (ref 150–400)
RBC: 3.9 MIL/uL (ref 3.87–5.11)
RDW: 13.2 % (ref 11.5–15.5)
WBC: 9.5 10*3/uL (ref 4.0–10.5)

## 2017-06-17 LAB — CULTURE, BODY FLUID-BOTTLE

## 2017-06-17 MED ORDER — HYDROMORPHONE HCL 1 MG/ML IJ SOLN
1.0000 mg | INTRAMUSCULAR | Status: DC | PRN
Start: 2017-06-17 — End: 2017-06-19
  Administered 2017-06-18 – 2017-06-19 (×4): 1 mg via INTRAVENOUS
  Filled 2017-06-17 (×5): qty 1

## 2017-06-17 MED ORDER — SODIUM CHLORIDE 0.9 % IV SOLN
8.0000 mg | INTRAVENOUS | Status: AC
  Administered 2017-06-17: 8 mg via INTRAVENOUS
  Filled 2017-06-17 (×2): qty 4

## 2017-06-17 MED ORDER — PNEUMOCOCCAL VAC POLYVALENT 25 MCG/0.5ML IJ INJ
0.5000 mL | INJECTION | INTRAMUSCULAR | Status: AC
  Administered 2017-06-18: 0.5 mL via INTRAMUSCULAR
  Filled 2017-06-17: qty 0.5

## 2017-06-17 MED ORDER — SODIUM CHLORIDE 0.9 % IV SOLN: 8.0000 mg | Freq: Once | INTRAVENOUS | Status: DC

## 2017-06-17 MED ORDER — DEXAMETHASONE 2 MG PO TABS
2.0000 mg | ORAL_TABLET | Freq: Two times a day (BID) | ORAL | Status: DC
  Administered 2017-06-17 – 2017-06-18 (×2): 2 mg via ORAL
  Filled 2017-06-17 (×3): qty 1

## 2017-06-17 MED ORDER — FENTANYL 25 MCG/HR TD PT72
25.0000 ug | MEDICATED_PATCH | TRANSDERMAL | Status: DC
  Administered 2017-06-17: 25 ug via TRANSDERMAL
  Filled 2017-06-17: qty 1

## 2017-06-17 MED ORDER — INFLUENZA VAC SPLIT QUAD 0.5 ML IM SUSY
0.5000 mL | PREFILLED_SYRINGE | INTRAMUSCULAR | Status: AC
  Administered 2017-06-18: 0.5 mL via INTRAMUSCULAR
  Filled 2017-06-17: qty 0.5

## 2017-06-17 MED ORDER — HYDROMORPHONE BOLUS VIA INFUSION
0.7500 mg | INTRAVENOUS | Status: AC
  Administered 2017-06-17: 0.8 mg via INTRAVENOUS
  Filled 2017-06-17: qty 1

## 2017-06-17 MED ORDER — METOCLOPRAMIDE HCL 10 MG PO TABS
10.0000 mg | ORAL_TABLET | Freq: Three times a day (TID) | ORAL | Status: DC
  Administered 2017-06-17 – 2017-06-19 (×8): 10 mg via ORAL
  Filled 2017-06-17 (×8): qty 1

## 2017-06-17 MED ORDER — HYDROMORPHONE HCL 2 MG PO TABS
2.0000 mg | ORAL_TABLET | ORAL | Status: DC | PRN
  Administered 2017-06-17: 2 mg via ORAL
  Administered 2017-06-18: 4 mg via ORAL
  Administered 2017-06-18 (×2): 2 mg via ORAL
  Administered 2017-06-18 – 2017-06-19 (×4): 4 mg via ORAL
  Filled 2017-06-17: qty 1
  Filled 2017-06-17 (×2): qty 2
  Filled 2017-06-17 (×2): qty 1
  Filled 2017-06-17 (×2): qty 2
  Filled 2017-06-17: qty 1
  Filled 2017-06-17: qty 2

## 2017-06-17 MED ORDER — HYDROMORPHONE HCL 1 MG/ML IJ SOLN: 1.0000 mg | INTRAMUSCULAR | Status: DC | PRN

## 2017-06-17 MED ORDER — DEXAMETHASONE SODIUM PHOSPHATE 4 MG/ML IJ SOLN: 2.0000 mg | Freq: Two times a day (BID) | INTRAMUSCULAR | Status: DC

## 2017-06-17 NOTE — Progress Notes (Signed)
PROGRESS NOTE    Sierra Beck  ZDG:[id number] DOB: 05-22-1956 DOA: 06/11/2017 PCP: Berkley Harvey, NP   Brief Narrative: 61 y.o.femalewith a history of HTN, HLD, back pain and tobacco use who prsented to the ED with a week of acutely worsening, constant, severe generalized abdominal pain associated with nausea and vomiting. Abdominal pain first started a few months ago. CT abdomen/pelvis showed peritoneal carcinomatosis, 4.4cm in distal body and tail of the pancreas, moderate ascites, and hypodense masses in the right hepatic lobe. CA 19-9 was found to be markedly elevated (>300k).IR was consulted for paracentesis which demonstrated only reactive inflammatory cells on cytology. Oncology and palliative care were consulted and a dilaudid PCA was required for pain control. Plan is to achieve adequate pain and nausea control and perform peritoneal biopsy 9/24.   Assessment & Plan:  # Probable pancreatic cancer with peritoneal and liver metastasis, ascites: -Evaluated by oncology, GI and IR. Cytology showed atypical cells likely reactive no definite evidence of malignancy. Plan for IR guided peritoneal mass biopsy today. -Continue pain management and follow-up oncology plan.  #Nausea vomiting and severe pain related with cancer: -Currently on IV Dilaudid, Benadryl, steroids. Palliative care consult appreciated for the pain management. Plan to wean down pain medication gradually discussed with the palliative care team. -It was discussed with the patient regarding PCP and oncology follow-up for the pain medication and management.  #History of hypertension: Monitor blood pressure. Continue Ziac (bisoprolol-hydrochlorothiazide).  #Hypokalemia: Serum potassium level improved.  #Constipation: Continue laxatives and stool softener.  DVT prophylaxis: Hold anticoagulation because of possible biopsy. SCD Code Status: Full code Family Communication: No family at bedside Disposition Plan: Currently  admitted    Consultants:   Oncology  IR  GI  Palliative care  Procedures: Plan for biopsy Antimicrobials: None  Subjective: Seen and examined at bedside. Currently on Dilaudid PCA per pain management. Mild nausea but denied vomiting. No chest pain or shortness of breath.  Objective: Vitals:   06/17/17 0833 06/17/17 1100 06/17/17 1158 06/17/17 1409  BP:  (!) 155/70  134/70  Pulse:  (!) 59  66  Resp: 18 17 18 18   Temp:  98.2 F (36.8 C)  98.1 F (36.7 C)  TempSrc:  Oral  Oral  SpO2: 96% 95% 92% 98%  Weight:      Height:        Intake/Output Summary (Last 24 hours) at 06/17/17 1544 Last data filed at 06/17/17 1409  Gross per 24 hour  Intake          1968.75 ml  Output                0 ml  Net          1968.75 ml   Filed Weights   06/16/17 1134  Weight: 79.2 kg (174 lb 9.6 oz)    Examination:  General exam: Appears calm and comfortable  Respiratory system: Clear to auscultation. Respiratory effort normal. No wheezing or crackle Cardiovascular system: S1 & S2 heard, RRR.  No pedal edema. Gastrointestinal system: Abdomen is soft, mild distention, bowel sounds positive Central nervous system: Alert and oriented. No focal neurological deficits. Skin: No rashes, lesions or ulcers Psychiatry: Judgement and insight appear normal. Mood & affect appropriate.     Data Reviewed: I have personally reviewed following labs and imaging studies  CBC:  Recent Labs Lab 06/11/17 1426 06/12/17 0439 06/16/17 0439 06/17/17 0245  WBC 9.8 8.2 8.3 9.5  NEUTROABS  --   --   --  8.1*  HGB 11.8* 10.8* 10.7* 10.8*  HCT 36.7 33.5* 34.0* 34.6*  MCV 89.5 89.6 89.0 88.7  PLT 345 295 302 409   Basic Metabolic Panel:  Recent Labs Lab 06/11/17 1426 06/12/17 0439 06/17/17 0245  NA 136 136 136  K 3.3* 4.5 3.8  CL 99* 104 102  CO2 26 27 28   GLUCOSE 127* 97 160*  BUN 12 11 7   CREATININE 0.71 0.76 0.52  CALCIUM 8.9 8.5* 8.3*   GFR: Estimated Creatinine Clearance: 71  mL/min (by C-G formula based on SCr of 0.52 mg/dL). Liver Function Tests:  Recent Labs Lab 06/11/17 1426  AST 23  ALT 13*  ALKPHOS 133*  BILITOT 0.4  PROT 6.7  ALBUMIN 3.2*    Recent Labs Lab 06/11/17 1426  LIPASE 24   No results for input(s): AMMONIA in the last 168 hours. Coagulation Profile:  Recent Labs Lab 06/16/17 0439  INR 1.23   Cardiac Enzymes: No results for input(s): CKTOTAL, CKMB, CKMBINDEX, TROPONINI in the last 168 hours. BNP (last 3 results) No results for input(s): PROBNP in the last 8760 hours. HbA1C: No results for input(s): HGBA1C in the last 72 hours. CBG: No results for input(s): GLUCAP in the last 168 hours. Lipid Profile: No results for input(s): CHOL, HDL, LDLCALC, TRIG, CHOLHDL, LDLDIRECT in the last 72 hours. Thyroid Function Tests: No results for input(s): TSH, T4TOTAL, FREET4, T3FREE, THYROIDAB in the last 72 hours. Anemia Panel: No results for input(s): VITAMINB12, FOLATE, FERRITIN, TIBC, IRON, RETICCTPCT in the last 72 hours. Sepsis Labs: No results for input(s): PROCALCITON, LATICACIDVEN in the last 168 hours.  Recent Results (from the past 240 hour(s))  Culture, body fluid-bottle     Status: None   Collection Time: 06/12/17 11:48 AM  Result Value Ref Range Status   Specimen Description FLUID PERITONEAL  Final   Special Requests NONE  Final   Culture NO GROWTH 5 DAYS  Final   Report Status 06/17/2017 FINAL  Final  Gram stain     Status: None   Collection Time: 06/12/17 11:48 AM  Result Value Ref Range Status   Specimen Description FLUID PERITONEAL  Final   Special Requests NONE  Final   Gram Stain   Final    ABUNDANT WBC PRESENT, PREDOMINANTLY MONONUCLEAR NO ORGANISMS SEEN    Report Status 06/12/2017 FINAL  Final         Radiology Studies: No results found.      Scheduled Meds: . diphenhydrAMINE  50 mg Oral QHS   And  . acetaminophen  1,000 mg Oral QHS  . aspirin EC  81 mg Oral Daily  .  bisoprolol-hydrochlorothiazide  1 tablet Oral Daily  . dexamethasone  2 mg Oral Q12H  . fentaNYL  25 mcg Transdermal Q72H  . HYDROmorphone   Intravenous Q4H  . [START ON 06/18/2017] Influenza vac split quadrivalent PF  0.5 mL Intramuscular Tomorrow-1000  . levothyroxine  88 mcg Oral QAC breakfast  . metoCLOPramide  10 mg Oral TID AC & HS  . nicotine  21 mg Transdermal Daily  . pantoprazole  40 mg Oral Daily  . [START ON 06/18/2017] pneumococcal 23 valent vaccine  0.5 mL Intramuscular Tomorrow-1000  . polyethylene glycol  17 g Oral BID  . pramipexole  0.125 mg Oral QHS  . rosuvastatin  10 mg Oral QPM  . senna  2 tablet Oral BID   Continuous Infusions: . sodium chloride 75 mL/hr at 06/17/17 0448  . ondansetron (ZOFRAN) IV Stopped (06/16/17 2200)  LOS: 6 days    Esty Ahuja Tanna Furry, MD [facility] Pager [phone]  If 7PM-7AM, please contact night-coverage [email or web address] Password [email or web address] 06/17/2017, 3:44 PM

## 2017-06-17 NOTE — Progress Notes (Addendum)
No charge note:  Patient reports pain improved with restarting basal rate. She is eager for biopsy and hungry.   Discussed with IR and biopsy has been postponed until tomorrow.  Reordered diet and entered orders to begin transition off of PCA per previous note. Discussed plan with patient and with patient's RN- Remo Lipps.  Mariana Kaufman, AGNP-C Palliative Medicine  Please call Palliative Medicine team phone with any questions [phone] 7am to 7pm.  For individual providers please see AMION.

## 2017-06-17 NOTE — Care Management Note (Signed)
Case Management Note  Patient Details  Name: STEVEN VEAZIE MRN: [id number] Date of Birth: Jun 20, 1956  Subjective/Objective:                    Action/Plan:  Consult to provide list of PCP's. Explained to patient she can call the number on her  insurance card and be provided with a list of MD's that are in network with her plan. Also provided [facility] phone number if she wants a [place] MD. Provided [facility] information.   Also idf patient has heard of a MD through friends,family etc she is interested in she can call that MD office directly to see if they accept her insurance and are taking new patients .   At end of conversion patient stated she does have a PCP and stated "maybe I'll go back to her" Expected Discharge Date:                  Expected Discharge Plan:  Home/Self Care  In-House Referral:     Discharge planning Services  CM Consult  Post Acute Care Choice:    Choice offered to:  Patient  DME Arranged:    DME Agency:     HH Arranged:    [place] Agency:     Status of Service:  Completed, signed off  If discussed at [employer] of Stay Meetings, dates discussed:    Additional Comments:  Marilu Favre, RN 06/17/2017, 12:48 PM

## 2017-06-17 NOTE — Progress Notes (Signed)
Daily Progress Note   Patient Name: Sierra Beck       Date: 06/17/2017 DOB: Aug 17, 1956  Age: 61 y.o. MRN#: [id number] Attending Physician: Rosita Fire, MD Primary Care Physician: Berkley Harvey, NP Admit Date: 06/11/2017  Reason for Consultation/Follow-up: Non pain symptom management, Pain control and Psychosocial/spiritual support  Subjective: Patient reports increasing pain starting in her back and radiating around to her abdomen. She is concerned that fluid is returning as this is how her pain started initially.  Interrogation of PCA shows that basal rate of hydromorphone is not running. In the last 2 hours she has had 4 demands and 4 deliveries totaling 3mg . She had one demand without delivery while I was in the room.  She reports a continued baseline of nausea that improves when her pain is better controlled.  She reports positive BM after lactulose enema, and BM on Saturday. No BM Sunday.  She is scheduled for biopsy today in IR.   We discussed plan to transition from PCA pump to fentanyl transdermal patch after she returns from biopsy and after PCA has run at basal rate to see if pain is controlled.    Review of Systems  Gastrointestinal: Positive for abdominal pain, heartburn and nausea.       Hiccups  Musculoskeletal: Positive for back pain.  Psychiatric/Behavioral: The patient is nervous/anxious.     Length of Stay: 6  Current Medications: Scheduled Meds:  . diphenhydrAMINE  50 mg Oral QHS   And  . acetaminophen  1,000 mg Oral QHS  . aspirin EC  81 mg Oral Daily  . bisoprolol-hydrochlorothiazide  1 tablet Oral Daily  . dexamethasone  4 mg Intravenous Q12H  . HYDROmorphone   Intravenous Q4H  . levothyroxine  88 mcg Oral QAC breakfast  . nicotine  21 mg  Transdermal Daily  . pantoprazole  40 mg Oral Daily  . polyethylene glycol  17 g Oral BID  . pramipexole  0.125 mg Oral QHS  . rosuvastatin  10 mg Oral QPM  . senna  2 tablet Oral BID    Continuous Infusions: . sodium chloride 75 mL/hr at 06/17/17 0448  . ondansetron (ZOFRAN) IV Stopped (06/16/17 2200)  . ondansetron (ZOFRAN) IV      PRN Meds: acetaminophen **OR** acetaminophen, bisacodyl, diphenhydrAMINE **OR** diphenhydrAMINE, LORazepam, naloxone **AND**  sodium chloride flush, naproxen, ondansetron (ZOFRAN) IV, simethicone  Physical Exam  Constitutional: She is oriented to person, place, and time. She appears well-developed and well-nourished. No distress.  Cardiovascular: Normal rate.   Pulmonary/Chest: Effort normal.  Abdominal: Soft. She exhibits distension.  Neurological: She is alert and oriented to person, place, and time.  Skin: Skin is warm and dry.  Psychiatric: She has a normal mood and affect. Her behavior is normal. Judgment and thought content normal.  Nursing note and vitals reviewed.           Vital Signs: BP (!) 155/70 (BP Location: Right Arm)   Pulse (!) 59   Temp 98.2 F (36.8 C) (Oral)   Resp 17   Ht 5\' 2"  (1.575 m)   Wt 79.2 kg (174 lb 9.6 oz)   SpO2 95%   BMI 31.93 kg/m  SpO2: SpO2: 95 % O2 Device: O2 Device: Not Delivered O2 Flow Rate: O2 Flow Rate (L/min): 1 L/min  Intake/output summary:  Intake/Output Summary (Last 24 hours) at 06/17/17 1115 Last data filed at 06/17/17 1100  Gross per 24 hour  Intake          1968.75 ml  Output                0 ml  Net          1968.75 ml   LBM: Last BM Date: 06/14/17 Baseline Weight: Weight: 79.2 kg (174 lb 9.6 oz) Most recent weight: Weight: 79.2 kg (174 lb 9.6 oz)       Palliative Assessment/Data: PPS: 50%      Patient Active Problem List   Diagnosis Date Noted  . Pancreatic mass   . Metastatic adenocarcinoma to liver ([place])   . Constipation   . Palliative care by specialist   . Abdominal  pain, generalized   . Peritoneal carcinomatosis ([place]) 06/11/2017  . Metastatic cancer ([place]) 06/11/2017  . Nausea & vomiting 06/11/2017  . Abdominal pain 06/11/2017  . HYPERLIPIDEMIA 10/21/2007  . Unspecified hypothyroidism 02/24/2007  . HYPERLIPIDEMIA 02/24/2007  . Essential hypertension 02/24/2007    Palliative Care Assessment & Plan   Patient Profile: 61 y.o.femalewith past medical history of diabetes, hypertension, HLD, back painadmitted on 9/18/2018with worsening abdominal pain, nausea and vomiting. Workup revealed large pancreatic mass, peritoneal carcinomatosis, abdominal ascites, and liver mass per CT- labs show CA-19 elevated sig- at 321,576, indicating likely metastatic pancreatic cancer. Patient admitted for symptom management and further workup. Palliative medicine consulted urgently for assistance with pain and nausea.   Assessment/Recommendations/Plan   Pain:   Plan to transition to Fentanyl duragesic 62mcg/hr patch based on equalanalgesic dosing after pt returns from IR biopsy  Hydromorphone po 2-4mg  q4hr prn breakthrough pain  Hydromorphone 1mg  IV q1hr prn for rescue pain not controlled with duragesic or po dilaudid  Taper IV dexamethasone to 2mg  IV, then will transition to po when patient is taking po meds again- recommend outpatient oncologist continue this and taper when seen  Nausea:   Continue metoclopramide 10mg  q8hr then transition to po when taking po medications  Continue Ondansetron 8mg  IV q6hr prn, transition to po when taking po  Bowel Regimen:  Continue Senna 2 po BID  Continue Miralax 17gm BID  Continue dulcolax suppository prn  Refer to care management for list of local primary care providers  Refer to care management for palliative services at home for [place] and symptom management   Goals of Care and Additional Recommendations:  Limitations on Scope of Treatment: Full  Scope Treatment  Code Status:  Full code  Prognosis:    Unable to determine likely limited due to likely metastatic pancreatic cancer diagnosis  Discharge Planning:  Home with Palliative Services  Care plan was discussed with patient and Dr. Rowe Pavy.  Thank you for allowing the Palliative Medicine Team to assist in the care of this patient.   Time In: 0930 Time Out: 1030 Total Time 60 minutes Prolonged Time Billed Yes      Greater than 50%  of this time was spent counseling and coordinating care related to the above assessment and plan.  Mariana Kaufman, AGNP-C Palliative Medicine   Please contact Palliative Medicine Team phone at [phone] for questions and concerns.

## 2017-06-18 ENCOUNTER — Other Ambulatory Visit (HOSPITAL_COMMUNITY): Payer: Self-pay | Admitting: Interventional Radiology

## 2017-06-18 ENCOUNTER — Inpatient Hospital Stay (HOSPITAL_COMMUNITY)

## 2017-06-18 MED ORDER — SODIUM CHLORIDE 0.9 % IV SOLN
INTRAVENOUS | Status: AC | PRN
  Administered 2017-06-18: 10 mL/h via INTRAVENOUS

## 2017-06-18 MED ORDER — HYDROMORPHONE HCL 2 MG PO TABS: 2.0000 mg | ORAL_TABLET | ORAL | 0 refills | Status: DC | PRN

## 2017-06-18 MED ORDER — DIPHENHYDRAMINE HCL 50 MG PO CAPS: 50.0000 mg | ORAL_CAPSULE | Freq: Three times a day (TID) | ORAL | 0 refills | Status: DC | PRN

## 2017-06-18 MED ORDER — FENTANYL CITRATE (PF) 100 MCG/2ML IJ SOLN
INTRAMUSCULAR | Status: AC
  Filled 2017-06-18: qty 2

## 2017-06-18 MED ORDER — GELATIN ABSORBABLE 12-7 MM EX MISC
CUTANEOUS | Status: AC
  Filled 2017-06-18: qty 1

## 2017-06-18 MED ORDER — DEXAMETHASONE 0.5 MG PO TABS
1.0000 mg | ORAL_TABLET | Freq: Two times a day (BID) | ORAL | Status: DC
  Administered 2017-06-18 – 2017-06-19 (×2): 1 mg via ORAL
  Filled 2017-06-18 (×3): qty 2

## 2017-06-18 MED ORDER — FENTANYL CITRATE (PF) 100 MCG/2ML IJ SOLN
INTRAMUSCULAR | Status: AC | PRN
  Administered 2017-06-18: 25 ug via INTRAVENOUS
  Administered 2017-06-18: 50 ug via INTRAVENOUS

## 2017-06-18 MED ORDER — LORAZEPAM 0.5 MG PO TABS: 0.5000 mg | ORAL_TABLET | Freq: Three times a day (TID) | ORAL | 0 refills | Status: DC | PRN

## 2017-06-18 MED ORDER — LIDOCAINE HCL 1 % IJ SOLN
INTRAMUSCULAR | Status: AC
  Filled 2017-06-18: qty 20

## 2017-06-18 MED ORDER — FLEET ENEMA 7-19 GM/118ML RE ENEM
1.0000 | ENEMA | Freq: Once | RECTAL | Status: AC
Start: 2017-06-18 — End: 2017-06-18
  Administered 2017-06-18: 1 via RECTAL
  Filled 2017-06-18 (×2): qty 1

## 2017-06-18 MED ORDER — FENTANYL 25 MCG/HR TD PT72: 25.0000 ug | MEDICATED_PATCH | TRANSDERMAL | 0 refills | Status: DC

## 2017-06-18 MED ORDER — METOCLOPRAMIDE HCL 10 MG PO TABS: 10.0000 mg | ORAL_TABLET | Freq: Three times a day (TID) | ORAL | 0 refills | Status: DC

## 2017-06-18 MED ORDER — DEXAMETHASONE 1 MG PO TABS: 1.0000 mg | ORAL_TABLET | Freq: Two times a day (BID) | ORAL | 0 refills | Status: AC

## 2017-06-18 MED ORDER — MIDAZOLAM HCL 2 MG/2ML IJ SOLN
INTRAMUSCULAR | Status: AC
  Filled 2017-06-18: qty 2

## 2017-06-18 MED ORDER — MIDAZOLAM HCL 2 MG/2ML IJ SOLN
INTRAMUSCULAR | Status: AC | PRN
  Administered 2017-06-18 (×2): 1 mg via INTRAVENOUS

## 2017-06-18 NOTE — Sedation Documentation (Signed)
Patient is resting comfortably. 

## 2017-06-18 NOTE — Care Management Note (Signed)
Case Management Note  Patient Details  Name: TEREKA THORLEY MRN: [id number] Date of Birth: 12-30-1955  Subjective/Objective:                    Action/Plan:  Home with Palliative Care following consult.   Spoke with patient at bedside. She has decided to remain with Berkley Harvey for PCP. Discussed Palliative Care following at home. Patient is in agreement . Choice offered. Patient would like [facility] [place] , called referral in , awaiting call back. Expected Discharge Date:                  Expected Discharge Plan:  Home/Self Care  In-House Referral:     Discharge planning Services  CM Consult  Post Acute Care Choice:    Choice offered to:  Patient  DME Arranged:    DME Agency:     HH Arranged:    [place] Agency:     Status of Service:  Completed, signed off  If discussed at [employer] of Stay Meetings, dates discussed:    Additional Comments:  Marilu Favre, RN 06/18/2017, 10:57 AM

## 2017-06-18 NOTE — Progress Notes (Signed)
Daily Progress Note   Patient Name: Sierra Beck       Date: 06/18/2017 DOB: [birth year]  Age: 61 y.o. MRN#: [id number] Attending Physician: Rosita Fire, MD Primary Care Physician: Berkley Harvey, NP Admit Date: 06/11/2017  Reason for Consultation/Follow-up: Non pain symptom management, Pain control and Psychosocial/spiritual support  Subjective: Patient sitting up on couch. Reports pain upon waking this morning. Eased with po pain medication.  She tells me she is sleepy but trying to fight it. Note she received dose of lorazepam this morning. Encouraged her to rest. Scheduled for biopsy by IR today.    Review of Systems  Gastrointestinal: Positive for abdominal pain, heartburn and nausea.       Hiccups  Musculoskeletal: Positive for back pain.  Psychiatric/Behavioral: The patient is nervous/anxious.     Length of Stay: 7  Current Medications: Scheduled Meds:  . diphenhydrAMINE  50 mg Oral QHS   And  . acetaminophen  1,000 mg Oral QHS  . aspirin EC  81 mg Oral Daily  . bisoprolol-hydrochlorothiazide  1 tablet Oral Daily  . dexamethasone  1 mg Oral Q12H  . fentaNYL  25 mcg Transdermal Q72H  . fentaNYL      . gelatin adsorbable      . levothyroxine  88 mcg Oral QAC breakfast  . lidocaine      . metoCLOPramide  10 mg Oral TID AC & HS  . midazolam      . nicotine  21 mg Transdermal Daily  . pantoprazole  40 mg Oral Daily  . polyethylene glycol  17 g Oral BID  . pramipexole  0.125 mg Oral QHS  . rosuvastatin  10 mg Oral QPM  . senna  2 tablet Oral BID    Continuous Infusions: . sodium chloride 75 mL/hr at 06/18/17 0454  . ondansetron (ZOFRAN) IV Stopped (06/16/17 2200)    PRN Meds: acetaminophen **OR** acetaminophen, bisacodyl, diphenhydrAMINE **OR**  diphenhydrAMINE, HYDROmorphone (DILAUDID) injection, HYDROmorphone, LORazepam, naloxone **AND** sodium chloride flush, naproxen, ondansetron (ZOFRAN) IV, simethicone  Physical Exam  Constitutional: She is oriented to person, place, and time. She appears well-developed and well-nourished. No distress.  Cardiovascular: Normal rate.   Pulmonary/Chest: Effort normal.  Abdominal: Soft. She exhibits distension.  Neurological: She is alert and oriented to person, place, and time.  Skin: Skin  is warm and dry.  Psychiatric: She has a normal mood and affect. Her behavior is normal. Judgment and thought content normal.  Nursing note and vitals reviewed.           Vital Signs: BP 140/71   Pulse 63   Temp 98.8 F (37.1 C) (Oral)   Resp 20   Ht 5\' 2"  (1.575 m)   Wt 79.2 kg (174 lb 9.6 oz)   SpO2 97%   BMI 31.93 kg/m  SpO2: SpO2: 97 % O2 Device: O2 Device: Not Delivered O2 Flow Rate: O2 Flow Rate (L/min): 1 L/min  Intake/output summary:   Intake/Output Summary (Last 24 hours) at 06/18/17 1419 Last data filed at 06/18/17 0800  Gross per 24 hour  Intake           2089.5 ml  Output                0 ml  Net           2089.5 ml   LBM: Last BM Date: 06/15/17 Baseline Weight: Weight: 79.2 kg (174 lb 9.6 oz) Most recent weight: Weight: 79.2 kg (174 lb 9.6 oz)       Palliative Assessment/Data: PPS: 50%      Patient Active Problem List   Diagnosis Date Noted  . Mass in the abdomen   . Pancreatic mass   . 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 Constipation   . Palliative care by specialist   . Abdominal pain, generalized   . Peritoneal carcinomatosis ([place]) 06/11/2017  . Metastatic cancer ([place]) 06/11/2017  . Nausea & vomiting 06/11/2017  . Abdominal pain 06/11/2017  . HYPERLIPIDEMIA 10/21/2007  . Unspecified hypothyroidism 02/24/2007  . HYPERLIPIDEMIA 02/24/2007  . Essential hypertension 02/24/2007    Palliative Care Assessment & Plan   Patient Profile: 61  y.o.femalewith past medical history of diabetes, hypertension, HLD, back painadmitted on 9/18/2018with worsening abdominal pain, nausea and vomiting. Workup revealed large pancreatic mass, peritoneal carcinomatosis, abdominal ascites, and liver mass per CT- labs show CA-19 elevated sig- at 321,576, indicating likely metastatic pancreatic cancer. Patient admitted for symptom management and further workup. Palliative medicine consulted urgently for assistance with pain and nausea.   Assessment/Recommendations/Plan  Plan for biopsy today in IR then patient to discharge home. I spoke with Eldridge Abrahams, NP- patient's primary MD and she agreed to see patient for follow-up and assume pain management until patient can be seen by Oncology. Additionally, patient will be seen by outpatient Palliative and they can assist with recommendations for pain and symptom management.   Please discharge with following medications in place for symptom management:   Pain:   Fentanyl duragesic 47mcg/hr patch q3 days  Hydromorphone po 2-4mg  q4hr prn breakthrough pain  Dexamethasone 1mg  po bid   Lorazepam .5mg  q4hr po prn   Nausea:   Metoclopramide 10mg  po TID ACHS  Ondansetron 8mg  po q6hr prn    Bowel Regimen:  Senna 2 tabs po BID  Miralax 17gm dissolved in 4oz water po BID  Dulcolax suppository PR prn  Smoking prophylaxis  Nicotine patch 21mg  TD daily  Goals of Care and Additional Recommendations:  Limitations on Scope of Treatment: Full Scope Treatment  Code Status:  Full code  Prognosis:   Unable to determine likely limited due to likely metastatic pancreatic cancer diagnosis  Discharge Planning:  Home with Palliative Services  Care plan was discussed with patient.   Thank you for allowing the Palliative Medicine Team to assist in the  care of this patient.   Time In: 0930 Time Out: 1000 Total Time 30 minutes Prolonged Time Billed No      Greater than 50%  of this time was  spent counseling and coordinating care related to the above assessment and plan.  Mariana Kaufman, AGNP-C Palliative Medicine   Please contact Palliative Medicine Team phone at [phone] for questions and concerns.

## 2017-06-18 NOTE — Sedation Documentation (Signed)
Patient denies pain and is resting comfortably.  

## 2017-06-18 NOTE — Progress Notes (Signed)
PROGRESS NOTE    Sierra Beck  NWG:[id number] DOB: 1956/06/03 DOA: 06/11/2017 PCP: Berkley Harvey, NP   Brief Narrative: 61 y.o.femalewith a history of HTN, HLD, back pain and tobacco use who prsented to the ED with a week of acutely worsening, constant, severe generalized abdominal pain associated with nausea and vomiting. Abdominal pain first started a few months ago. CT abdomen/pelvis showed peritoneal carcinomatosis, 4.4cm in distal body and tail of the pancreas, moderate ascites, and hypodense masses in the right hepatic lobe. CA 19-9 was found to be markedly elevated (>300k).IR was consulted for paracentesis which demonstrated only reactive inflammatory cells on cytology. Oncology and palliative care were consulted and a dilaudid PCA was required for pain control. Plan is to achieve adequate pain and nausea control and perform peritoneal biopsy 9/24.   Assessment & Plan:  # Probable pancreatic cancer with peritoneal and liver metastasis, ascites: -Evaluated by oncology, GI and IR. Cytology showed atypical cells likely reactive no definite evidence of malignancy. Plan for IR guided peritoneal mass biopsy today. Patient is currently NPO.  #Nausea vomiting and severe pain related with cancer: -Currently on IV Dilaudid, Benadryl, steroids, fentanyl patch. Palliative care consult appreciated for the pain management. Plan to wean from IV Dilaudid to oral and patch. -It was discussed with the patient regarding PCP and oncology follow-up for the pain medication and management. -Discontinued IV fluid after the biopsy.  #History of hypertension: Monitor blood pressure. Continue Ziac (bisoprolol-hydrochlorothiazide). Blood pressure level acceptable.  #Hypokalemia: Serum potassium level improved.  #Constipation: Continue laxatives and stool softener. No bowel movement therefore order enema today. Patient is on senna twice a day, MiraLAX twice a day, Dulcolax as needed  DVT prophylaxis: Hold  anticoagulation because of peritoneal biopsy today. Code Status: Full code Family Communication: No family at bedside Disposition Plan: Currently admitted    Consultants:   Oncology  IR  GI  Palliative care  Procedures: Plan for biopsy Antimicrobials: None  Subjective: Seen and examined at bedside. Pain is controlled with the current regimen. Denied nausea vomiting. Waiting for biopsy. Objective: Vitals:   06/18/17 1305 06/18/17 1310 06/18/17 1320 06/18/17 1339  BP: 131/74 130/73 131/66 124/69  Pulse: (!) 58 60 (!) 54 (!) 57  Resp: 12 13 18    Temp:      TempSrc:      SpO2: 96% 96% 93% 95%  Weight:      Height:        Intake/Output Summary (Last 24 hours) at 06/18/17 1414 Last data filed at 06/18/17 0800  Gross per 24 hour  Intake           2089.5 ml  Output                0 ml  Net           2089.5 ml   Filed Weights   06/16/17 1134  Weight: 79.2 kg (174 lb 9.6 oz)    Examination:  General exam: Not in distress Respiratory system: Clear bilaterally, respiratory effort normal Cardiovascular system: Regular rate and rhythm, S1-S2 normal. No pedal edema Gastrointestinal system: Abdomen soft, mild distention. Bowel sound positive. Central nervous system: Alert and oriented. No focal neurological deficits. Skin: No rashes, lesions or ulcers Psychiatry: Judgement and insight appear normal. Mood & affect appropriate.     Data Reviewed: I have personally reviewed following labs and imaging studies  CBC:  Recent Labs Lab 06/11/17 1426 06/12/17 0439 06/16/17 0439 06/17/17 0245  WBC 9.8 8.2 8.3  9.5  NEUTROABS  --   --   --  8.1*  HGB 11.8* 10.8* 10.7* 10.8*  HCT 36.7 33.5* 34.0* 34.6*  MCV 89.5 89.6 89.0 88.7  PLT 345 295 302 983   Basic Metabolic Panel:  Recent Labs Lab 06/11/17 1426 06/12/17 0439 06/17/17 0245  NA 136 136 136  K 3.3* 4.5 3.8  CL 99* 104 102  CO2 26 27 28   GLUCOSE 127* 97 160*  BUN 12 11 7   CREATININE 0.71 0.76 0.52    CALCIUM 8.9 8.5* 8.3*   GFR: Estimated Creatinine Clearance: 71 mL/min (by C-G formula based on SCr of 0.52 mg/dL). Liver Function Tests:  Recent Labs Lab 06/11/17 1426  AST 23  ALT 13*  ALKPHOS 133*  BILITOT 0.4  PROT 6.7  ALBUMIN 3.2*    Recent Labs Lab 06/11/17 1426  LIPASE 24   No results for input(s): AMMONIA in the last 168 hours. Coagulation Profile:  Recent Labs Lab 06/16/17 0439  INR 1.23   Cardiac Enzymes: No results for input(s): CKTOTAL, CKMB, CKMBINDEX, TROPONINI in the last 168 hours. BNP (last 3 results) No results for input(s): PROBNP in the last 8760 hours. HbA1C: No results for input(s): HGBA1C in the last 72 hours. CBG: No results for input(s): GLUCAP in the last 168 hours. Lipid Profile: No results for input(s): CHOL, HDL, LDLCALC, TRIG, CHOLHDL, LDLDIRECT in the last 72 hours. Thyroid Function Tests: No results for input(s): TSH, T4TOTAL, FREET4, T3FREE, THYROIDAB in the last 72 hours. Anemia Panel: No results for input(s): VITAMINB12, FOLATE, FERRITIN, TIBC, IRON, RETICCTPCT in the last 72 hours. Sepsis Labs: No results for input(s): PROCALCITON, LATICACIDVEN in the last 168 hours.  Recent Results (from the past 240 hour(s))  Culture, body fluid-bottle     Status: None   Collection Time: 06/12/17 11:48 AM  Result Value Ref Range Status   Specimen Description FLUID PERITONEAL  Final   Special Requests NONE  Final   Culture NO GROWTH 5 DAYS  Final   Report Status 06/17/2017 FINAL  Final  Gram stain     Status: None   Collection Time: 06/12/17 11:48 AM  Result Value Ref Range Status   Specimen Description FLUID PERITONEAL  Final   Special Requests NONE  Final   Gram Stain   Final    ABUNDANT WBC PRESENT, PREDOMINANTLY MONONUCLEAR NO ORGANISMS SEEN    Report Status 06/12/2017 FINAL  Final         Radiology Studies: No results found.      Scheduled Meds: . diphenhydrAMINE  50 mg Oral QHS   And  . acetaminophen  1,000  mg Oral QHS  . aspirin EC  81 mg Oral Daily  . bisoprolol-hydrochlorothiazide  1 tablet Oral Daily  . dexamethasone  2 mg Oral Q12H  . fentaNYL  25 mcg Transdermal Q72H  . fentaNYL      . gelatin adsorbable      . levothyroxine  88 mcg Oral QAC breakfast  . lidocaine      . metoCLOPramide  10 mg Oral TID AC & HS  . midazolam      . nicotine  21 mg Transdermal Daily  . pantoprazole  40 mg Oral Daily  . polyethylene glycol  17 g Oral BID  . pramipexole  0.125 mg Oral QHS  . rosuvastatin  10 mg Oral QPM  . senna  2 tablet Oral BID   Continuous Infusions: . sodium chloride 75 mL/hr at 06/18/17 0454  .  ondansetron (ZOFRAN) IV Stopped (06/16/17 2200)     LOS: 7 days    Dron Tanna Furry, MD [facility] Pager [phone]  If 7PM-7AM, please contact night-coverage [email or web address] Password [facility] 06/18/2017, 2:14 PM

## 2017-06-18 NOTE — Procedures (Signed)
Interventional Radiology Procedure Note  Procedure: CT guided biopsy of peritoneal tumor  Complications: None  Estimated Blood Loss: < 10 mL  18 G core biopsy x 3 via 17 G needle of anterior peritoneal tumor in upper pelvis.  Solid tissue obtained.  Gelfoam injected via needle on completion.  Venetia Night. Kathlene Cote, M.D Pager:  [phone]

## 2017-06-18 NOTE — Care Management (Signed)
See previous note . Called home with palliative care referral to Andrey Campanile at [facility]. Left voicemail . Awaiting call back.   Magdalen Spatz RN BSN [phone]

## 2017-06-19 ENCOUNTER — Telehealth: Payer: Self-pay | Admitting: Hematology

## 2017-06-19 DIAGNOSIS — C259 Malignant neoplasm of pancreas, unspecified: Secondary | ICD-10-CM | POA: Insufficient documentation

## 2017-06-19 DIAGNOSIS — Z515 Encounter for palliative care: Secondary | ICD-10-CM

## 2017-06-19 MED ORDER — POLYETHYLENE GLYCOL 3350 17 G PO PACK: 17.0000 g | PACK | Freq: Two times a day (BID) | ORAL | 0 refills | Status: AC

## 2017-06-19 MED ORDER — SENNA 8.6 MG PO TABS: 2.0000 | ORAL_TABLET | Freq: Two times a day (BID) | ORAL | 0 refills | Status: DC

## 2017-06-19 MED ORDER — BISACODYL 10 MG RE SUPP: 10.0000 mg | Freq: Every day | RECTAL | 0 refills | Status: AC | PRN

## 2017-06-19 MED ORDER — LORAZEPAM 0.5 MG PO TABS: 0.5000 mg | ORAL_TABLET | ORAL | 0 refills | Status: AC | PRN

## 2017-06-19 NOTE — Care Management (Addendum)
  Dr Clifton [facility]  From [facility] [place] called wanting hospital MD to call patient's PCP and get approval for Palliative Care at home. Mariana Kaufman  NP has already done same. Explained same to DR [place]. Dr Konrad Dolores requesting MD to MD referral.  Discussed with Juanda Crumble NP.  Received call from [facility] Palliative team they have discussed above. They are suggesting referral to [place] for palliative care services at home. Discussed with patient she is in  agreement.  Referral given to Cheri at [place].  Magdalen Spatz RN BSN [phone]

## 2017-06-19 NOTE — Telephone Encounter (Signed)
Hospital follow up has been scheduled for the pt to see Dr. Burr Medico on 9/27 at 3pm. Pt is aware to arrive 30 minutes early.

## 2017-06-19 NOTE — Progress Notes (Signed)
PMT progress note.   Patient seen in her room prior to discharge, son at bedside.   We reviewed the patient's pain and non pain medication regimen in detail.  D/C medication list also noted.   Patient is to see Dr Burr Medico from oncology on 9-27 at 1500.   BP 130/72   Pulse (!) 59   Temp 98.4 F (36.9 C)   Resp 16   Ht 5\' 2"  (1.575 m)   Wt 79.2 kg (174 lb 9.6 oz)   SpO2 97%   BMI 31.93 kg/m   Awake alert oriented  Sitting up in chair Regular S1 S2 Clear  Abdomen: still with some what tenderness near biopsy site No edema Awake alert non focal  PLAN: 1. Fentanyl patch 25 mcg, to be changed tomorrow, after 72 hours.  2. PO Dilaudid, discussed in detail with patient and son, to have 2-4 mg PO Q 3 hours on as needed basis if pain is uncontrolled, other wise, patient is to adhere to [facility] med list which says 2 mg PO Q 4 hours PRN pain.  3. Also discussed about taking PO Ativan for anxiety and rest less ness on an as needed basis.  4. Patient will meet with Dr Burr Medico tomorrow.   I have offered her active listening and supportive care, as she prepares for D/C home. All of the patient and son's questions answered to the best of my ability.   25 minutes spent.  Loistine Chance MD [place] Team

## 2017-06-19 NOTE — Discharge Summary (Addendum)
Physician Discharge Summary  Sierra Beck XWR:[id number] DOB: 1955/11/23 DOA: 06/11/2017  PCP: Berkley Harvey, NP  Admit date: 06/11/2017 Discharge date: 06/19/2017  Admitted From:home Disposition:home  Recommendations for Outpatient Follow-up:  1. Follow up with PCP in 1 weeks 2. Please obtain BMP/CBC in one week 3. Dr. Ernestina Penna clinic will call for appointment within a week.  Home Health:no Equipment/Devices:none Discharge Condition:stable CODE STATUS:full code Diet recommendation:heart healthy  Brief/Interim Summary: 61 y.o.femalewith a history of HTN, HLD, back pain and tobacco use who prsented to the ED with a week of acutely worsening, constant, severe generalized abdominal pain associated with nausea and vomiting. Abdominal pain first started a few months ago. CT abdomen/pelvis showed peritoneal carcinomatosis, 4.4cm in distal body and tail of the pancreas, moderate ascites, and hypodense masses in the right hepatic lobe. CA 19-9 was found to be markedly elevated (>300k).IR was consulted for paracentesis which demonstrated only reactive inflammatory cells on cytology. Oncology and palliative care were consulted and a dilaudid PCA was required for pain control. Currently pain controlled with oral pills and patch. s/p peritoneal biopsy 9/24.   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 Cytology showed atypical cells likely reactive no definite evidence of malignancy. S/p IR guided peritoneal mass biopsy on 9/25.  -I discussed with Dr. Burr Medico from oncology. Her clinic will call the patient when biopsy result is back.   #Nausea vomiting and severe pain related with cancer: -Patient is currently controlled with fentanyl patch, oral Dilaudid, steroid. Prescription provided to the patient. Evaluated by palliative care. Recommended to follow up with PCP and oncology. Palliative care consult appreciated for arranging outpatient  follow-up. Patient with no nausea vomiting. Tolerating diet well. Ordered a bowel regimen for constipation.  #History of hypertension: Monitor blood pressure. Continue Ziac (bisoprolol-hydrochlorothiazide). Blood pressure level acceptable.  #Hypokalemia: Serum potassium level improved.  #Constipation: Had a bowel movement today. Continue stool softener and laxatives. Patient verbalized understanding.  Discussed with oncology. Follow-up biopsy result. Continue current pain medication. Patient was educated not to drive nor involved in heavy machinery while on pain medication. She verbalized understanding. At this time patient will be discharged home with outpatient follow-up. Oncology clinic will call the patient for follow-up appointment.  Discharge Diagnoses:  Principal Problem:   Metastatic cancer ([place]) Active Problems:   Essential hypertension   Peritoneal carcinomatosis ([facility])   Nausea & vomiting   Abdominal pain   Metastatic adenocarcinoma to liver [facility])   Constipation   Palliative care by specialist   Abdominal pain, generalized   Pancreatic mass   Mass in the abdomen    Discharge Instructions  Discharge Instructions    Call MD for:  difficulty breathing, headache or visual disturbances    Complete by:  As directed    Call MD for:  extreme fatigue    Complete by:  As directed    Call MD for:  hives    Complete by:  As directed    Call MD for:  persistant dizziness or light-headedness    Complete by:  As directed    Call MD for:  persistant nausea and vomiting    Complete by:  As directed    Call MD for:  severe uncontrolled pain    Complete by:  As directed    Call MD for:  temperature >100.4    Complete by:  As directed    Diet - low sodium heart healthy    Complete by:  As directed    Discharge instructions    Complete by:  As directed    Please don't drive while on pain medications. Please follow up with your PCP, palliative care and oncology.   Increase  activity slowly    Complete by:  As directed      Allergies as of 06/19/2017      Reactions   Codeine Itching, Nausea Only   Severe itching in larger doses      Medication List    STOP taking these medications   diphenhydramine-acetaminophen 25-500 MG Tabs tablet Commonly known as:  TYLENOL PM   traMADol 50 MG tablet Commonly known as:  ULTRAM     TAKE these medications   aspirin EC 81 MG tablet Take 81 mg by mouth daily.   bisacodyl 10 MG suppository Commonly known as:  DULCOLAX Place 1 suppository (10 mg total) rectally daily as needed for moderate constipation.   bisoprolol-hydrochlorothiazide 2.5-6.25 MG tablet Commonly known as:  ZIAC Take 1 tablet by mouth daily.   dexamethasone 1 MG tablet Commonly known as:  DECADRON Take 1 tablet (1 mg total) by mouth every 12 (twelve) hours.   diphenhydrAMINE 50 MG capsule Commonly known as:  BENADRYL Take 1 capsule (50 mg total) by mouth every 8 (eight) hours as needed.   fentaNYL 25 MCG/HR patch Commonly known as:  DURAGESIC - dosed mcg/hr Place 1 patch (25 mcg total) onto the skin every 3 (three) days.   HYDROmorphone 2 MG tablet Commonly known as:  DILAUDID Take 1 tablet (2 mg total) by mouth every 4 (four) hours as needed (For breakthrough pain).   levothyroxine 88 MCG tablet Commonly known as:  SYNTHROID, LEVOTHROID Take 88 mcg by mouth daily before breakfast.   LORazepam 0.5 MG tablet Commonly known as:  ATIVAN Place 1 tablet (0.5 mg total) under the tongue every 4 (four) hours as needed for anxiety (offer if patient seems anxious, offer at bedtime).   metoCLOPramide 10 MG tablet Commonly known as:  REGLAN Take 1 tablet (10 mg total) by mouth 4 (four) times daily -  before meals and at bedtime.   naproxen sodium 220 MG tablet Commonly known as:  ANAPROX Take 220-440 mg by mouth every 4 (four) hours as needed (for pain).   pantoprazole 20 MG tablet Commonly known as:  PROTONIX Take 20 mg by mouth  daily.   polyethylene glycol packet Commonly known as:  MIRALAX / GLYCOLAX Take 17 g by mouth 2 (two) times daily.   pramipexole 0.125 MG tablet Commonly known as:  MIRAPEX Take 0.125 mg by mouth at bedtime.   rosuvastatin 10 MG tablet Commonly known as:  CRESTOR Take 10 mg by mouth every evening.   senna 8.6 MG Tabs tablet Commonly known as:  SENOKOT Take 2 tablets (17.2 mg total) by mouth 2 (two) times daily.   simethicone 80 MG chewable tablet Commonly known as:  MYLICON Chew 80 mg by mouth every 6 (six) hours as needed for flatulence.            Discharge Care Instructions        Start     Ordered   06/20/17 0000  fentaNYL (DURAGESIC - DOSED MCG/HR) 25 MCG/HR patch  every 72 hours     06/18/17 1626   06/19/17 0000  LORazepam (ATIVAN) 0.5 MG tablet  Every 4 hours PRN     06/19/17 1006   06/19/17 0000  bisacodyl (DULCOLAX) 10 MG suppository  Daily PRN  06/19/17 1006   06/19/17 0000  polyethylene glycol (MIRALAX / GLYCOLAX) packet  2 times daily     06/19/17 1006   06/19/17 0000  senna (SENOKOT) 8.6 MG TABS tablet  2 times daily     06/19/17 1006   06/19/17 0000  Increase activity slowly     06/19/17 1006   06/19/17 0000  Diet - low sodium heart healthy     06/19/17 1006   06/19/17 0000  Discharge instructions    Comments:  Please don't drive while on pain medications. Please follow up with your PCP, palliative care and oncology.   06/19/17 1006   06/19/17 0000  Call MD for:  temperature >100.4     06/19/17 1006   06/19/17 0000  Call MD for:  persistant nausea and vomiting     06/19/17 1006   06/19/17 0000  Call MD for:  severe uncontrolled pain     06/19/17 1006   06/19/17 0000  Call MD for:  difficulty breathing, headache or visual disturbances     06/19/17 1006   06/19/17 0000  Call MD for:  hives     06/19/17 1006   06/19/17 0000  Call MD for:  persistant dizziness or light-headedness     06/19/17 1006   06/19/17 0000  Call MD for:  extreme fatigue      06/19/17 1006   06/18/17 0000  dexamethasone (DECADRON) 1 MG tablet  Every 12 hours     06/18/17 1626   06/18/17 0000  diphenhydrAMINE (BENADRYL) 50 MG capsule  Every 8 hours PRN     06/18/17 1626   06/18/17 0000  HYDROmorphone (DILAUDID) 2 MG tablet  Every 4 hours PRN     06/18/17 1626   06/18/17 0000  metoCLOPramide (REGLAN) 10 MG tablet  3 times daily before meals & bedtime     06/18/17 1626     Follow-up Information    Berkley Harvey, NP. Schedule an appointment as soon as possible for a visit in 1 week(s).   Specialty:  Nurse Practitioner Contact information: [address] [place] [address] [phone]        Truitt Merle, MD Follow up on 06/20/2017.   Specialties:  Hematology, Oncology Why:  please call clinic to confirm the appointment.  Contact information: [place] [address] [phone]        [facility] Follow up.   Why:  Palliative Care follow  Contact information: [place] [address] [phone]          Allergies  Allergen Reactions  . Codeine Itching and Nausea Only    Severe itching in larger doses    Consultations:  Oncology  IR  GI  Palliative care  Procedures/Studies: Pericardial biopsy  Subjective: Seen and examined at bedside. Patient is walking in the room. Denied headache, dizziness, nausea vomiting chest pain or shortness of breath. No abdominal pain. Had bowel movement.  Discharge Exam: Vitals:   06/19/17 0240 06/19/17 0628  BP: 119/60 130/72  Pulse: 60 (!) 59  Resp: 16 16  Temp: 98.9 F (37.2 C) 98.4 F (36.9 C)  SpO2: 97% 97%   Vitals:   06/18/17 1416 06/18/17 2300 06/19/17 0240 06/19/17 0628  BP: 140/71 132/69 119/60 130/72  Pulse: 63 (!) 56 60 (!) 59  Resp: 20 17 16 16   Temp: 98.8 F (37.1 C) 98.5 F (36.9 C) 98.9 F (37.2 C) 98.4 F (36.9 C)  TempSrc: Oral Oral    SpO2: 97% 98% 97% 97%  Weight:      Height:         General: Pt is alert, awake, not in acute distress Cardiovascular: RRR, S1/S2 +, no rubs, no gallops Respiratory: CTA bilaterally, no wheezing, no rhonchi Abdominal: Soft, NT, Mild distention unchanged, bowel sounds + Extremities: no edema, no cyanosis Alert, awake and oriented.   The results of significant diagnostics from this hospitalization (including imaging, microbiology, ancillary and laboratory) are listed below for reference.     Microbiology: Recent Results (from the past 240 hour(s))  Culture, body fluid-bottle     Status: None   Collection Time: 06/12/17 11:48 AM  Result Value Ref Range Status   Specimen Description FLUID PERITONEAL  Final   Special Requests NONE  Final   Culture NO GROWTH 5 DAYS  Final   Report Status 06/17/2017 FINAL  Final  Gram stain     Status: None   Collection Time: 06/12/17 11:48 AM  Result Value Ref Range Status   Specimen Description FLUID PERITONEAL  Final   Special Requests NONE  Final   Gram Stain   Final    ABUNDANT WBC PRESENT, PREDOMINANTLY MONONUCLEAR NO ORGANISMS SEEN    Report Status 06/12/2017 FINAL  Final     Labs: BNP (last 3 results) No results for input(s): BNP in the last 8760 hours. Basic Metabolic Panel:  Recent Labs Lab 06/17/17 0245  NA 136  K 3.8  CL 102  CO2 28  GLUCOSE 160*  BUN 7  CREATININE 0.52  CALCIUM 8.3*   Liver Function Tests: No results for input(s): AST, ALT, ALKPHOS, BILITOT, PROT, ALBUMIN in the last 168 hours. No results for input(s): LIPASE, AMYLASE in the last 168 hours. No results for input(s): AMMONIA in the last 168 hours. CBC:  Recent Labs Lab 06/16/17 0439 06/17/17 0245  WBC 8.3 9.5  NEUTROABS  --  8.1*  HGB 10.7* 10.8*  HCT 34.0* 34.6*  MCV 89.0 88.7  PLT 302 285   Cardiac Enzymes: No results for input(s): CKTOTAL, CKMB, CKMBINDEX, TROPONINI in the last 168 hours. BNP: Invalid input(s): POCBNP CBG: No results for input(s): GLUCAP in the last 168  hours. D-Dimer No results for input(s): DDIMER in the last 72 hours. Hgb A1c No results for input(s): HGBA1C in the last 72 hours. Lipid Profile No results for input(s): CHOL, HDL, LDLCALC, TRIG, CHOLHDL, LDLDIRECT in the last 72 hours. Thyroid function studies No results for input(s): TSH, T4TOTAL, T3FREE, THYROIDAB in the last 72 hours.  Invalid input(s): FREET3 Anemia work up No results for input(s): VITAMINB12, FOLATE, FERRITIN, TIBC, IRON, RETICCTPCT in the last 72 hours. Urinalysis    Component Value Date/Time   COLORURINE AMBER (A) 06/11/2017 1830   APPEARANCEUR HAZY (A) 06/11/2017 1830   LABSPEC 1.023 06/11/2017 1830   PHURINE 6.0 06/11/2017 1830   GLUCOSEU NEGATIVE 06/11/2017 1830   HGBUR NEGATIVE 06/11/2017 1830   BILIRUBINUR NEGATIVE 06/11/2017 1830   KETONESUR NEGATIVE 06/11/2017 1830   PROTEINUR 100 (A) 06/11/2017 1830   UROBILINOGEN 0.2 03/08/2013 1333   NITRITE NEGATIVE 06/11/2017 1830   LEUKOCYTESUR NEGATIVE 06/11/2017 1830   Sepsis Labs Invalid input(s): PROCALCITONIN,  WBC,  LACTICIDVEN Microbiology Recent Results (from the past 240 hour(s))  Culture, body fluid-bottle     Status: None   Collection Time: 06/12/17 11:48 AM  Result Value Ref Range Status   Specimen Description FLUID PERITONEAL  Final   Special Requests NONE  Final  Culture NO GROWTH 5 DAYS  Final   Report Status 06/17/2017 FINAL  Final  Gram stain     Status: None   Collection Time: 06/12/17 11:48 AM  Result Value Ref Range Status   Specimen Description FLUID PERITONEAL  Final   Special Requests NONE  Final   Gram Stain   Final    ABUNDANT WBC PRESENT, PREDOMINANTLY MONONUCLEAR NO ORGANISMS SEEN    Report Status 06/12/2017 FINAL  Final     Time coordinating discharge: 32 minutes  SIGNED:   Rosita Fire, MD  [facility] 06/19/2017, 1:40 PM  If 7PM-7AM, please contact night-coverage [email or web address] Password [email or web address]

## 2017-06-19 NOTE — Progress Notes (Signed)
Pt was able to talk to Palliative Dr Rowe Pavy re pain management. Pt feeling better. Discharge instructions was given to pt, verbalized understanding. Discharged to home accompanied by son.

## 2017-06-20 ENCOUNTER — Ambulatory Visit (HOSPITAL_BASED_OUTPATIENT_CLINIC_OR_DEPARTMENT_OTHER): Admitting: Hematology

## 2017-06-20 ENCOUNTER — Encounter: Payer: Self-pay | Admitting: Hematology

## 2017-06-20 ENCOUNTER — Telehealth: Payer: Self-pay | Admitting: Hematology

## 2017-06-20 VITALS — BP 109/65 | HR 64 | Temp 97.9°F | Resp 17 | Ht 62.0 in | Wt 163.7 lb

## 2017-06-20 DIAGNOSIS — I10 Essential (primary) hypertension: Secondary | ICD-10-CM

## 2017-06-20 DIAGNOSIS — R109 Unspecified abdominal pain: Secondary | ICD-10-CM | POA: Diagnosis not present

## 2017-06-20 DIAGNOSIS — R634 Abnormal weight loss: Secondary | ICD-10-CM | POA: Diagnosis not present

## 2017-06-20 DIAGNOSIS — R110 Nausea: Secondary | ICD-10-CM

## 2017-06-20 DIAGNOSIS — R630 Anorexia: Secondary | ICD-10-CM

## 2017-06-20 DIAGNOSIS — C259 Malignant neoplasm of pancreas, unspecified: Secondary | ICD-10-CM | POA: Diagnosis not present

## 2017-06-20 DIAGNOSIS — C787 Secondary malignant neoplasm of liver and intrahepatic bile duct: Secondary | ICD-10-CM | POA: Diagnosis not present

## 2017-06-20 DIAGNOSIS — C801 Malignant (primary) neoplasm, unspecified: Secondary | ICD-10-CM

## 2017-06-20 DIAGNOSIS — E119 Type 2 diabetes mellitus without complications: Secondary | ICD-10-CM

## 2017-06-20 DIAGNOSIS — R188 Other ascites: Secondary | ICD-10-CM | POA: Diagnosis not present

## 2017-06-20 DIAGNOSIS — C786 Secondary malignant neoplasm of retroperitoneum and peritoneum: Secondary | ICD-10-CM | POA: Diagnosis not present

## 2017-06-20 DIAGNOSIS — R1084 Generalized abdominal pain: Secondary | ICD-10-CM

## 2017-06-20 MED ORDER — FENTANYL 25 MCG/HR TD PT72: 25.0000 ug | MEDICATED_PATCH | TRANSDERMAL | 0 refills | Status: DC

## 2017-06-20 MED ORDER — METOCLOPRAMIDE HCL 10 MG PO TABS: 10.0000 mg | ORAL_TABLET | Freq: Three times a day (TID) | ORAL | 2 refills | Status: AC

## 2017-06-20 MED ORDER — ONDANSETRON HCL 8 MG PO TABS: 8.0000 mg | ORAL_TABLET | Freq: Three times a day (TID) | ORAL | 2 refills | Status: AC | PRN

## 2017-06-20 NOTE — Telephone Encounter (Signed)
Scheduled Chem EDU class per 9/27 los.
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
## 2017-06-22 ENCOUNTER — Other Ambulatory Visit: Payer: Self-pay | Admitting: Hematology & Oncology

## 2017-06-22 DIAGNOSIS — C259 Malignant neoplasm of pancreas, unspecified: Secondary | ICD-10-CM

## 2017-06-22 MED ORDER — HYDROMORPHONE HCL 2 MG PO TABS: 2.0000 mg | ORAL_TABLET | ORAL | 0 refills | Status: DC | PRN

## 2017-06-24 ENCOUNTER — Telehealth: Payer: Self-pay | Admitting: *Deleted

## 2017-06-24 NOTE — Telephone Encounter (Signed)
Pt's son Tommi Rumps called  the after hours call center to order meds for his mother.  He stated that he has already received the prescriptions that she needed.  He confirmed that she will be here for a chemo class tomorrow and afterwards they will contact us to make necessary appts for further tx.

## 2017-06-25 ENCOUNTER — Other Ambulatory Visit

## 2017-06-25 ENCOUNTER — Encounter: Payer: Self-pay | Admitting: *Deleted

## 2017-06-26 ENCOUNTER — Telehealth: Payer: Self-pay | Admitting: *Deleted

## 2017-06-26 ENCOUNTER — Other Ambulatory Visit: Payer: Self-pay | Admitting: Hematology

## 2017-06-26 ENCOUNTER — Other Ambulatory Visit: Payer: Self-pay | Admitting: *Deleted

## 2017-06-26 DIAGNOSIS — C259 Malignant neoplasm of pancreas, unspecified: Secondary | ICD-10-CM

## 2017-06-26 DIAGNOSIS — C786 Secondary malignant neoplasm of retroperitoneum and peritoneum: Secondary | ICD-10-CM

## 2017-06-26 DIAGNOSIS — C801 Malignant (primary) neoplasm, unspecified: Principal | ICD-10-CM

## 2017-06-26 DIAGNOSIS — C799 Secondary malignant neoplasm of unspecified site: Secondary | ICD-10-CM

## 2017-06-26 NOTE — Telephone Encounter (Signed)
I will schedule lab, f/u and chemo gemcitabine, starting next week, will order IR port placement, please let pt's son know.  Truitt Merle MD

## 2017-06-26 NOTE — Telephone Encounter (Signed)
Voicemail left for Tiffany with IR to arrange port-a-cath placement for this patient to begin chemotherapy 07-02-2017.  Ms. Ayuso reports she "feels okay".  Voice pleasant with no hints of distress.  Shared information with patient.  "My son had to go to work.  When will this be done?  What time do I need to have paracentesis tomorrow? "  Goal is for port placement as soon as possible to begin chemotherapy on 07-02-2017.  Waiting to hear from IR.  Report to [facility] tomorrow at 12:45 pm.  Denies any further questions.

## 2017-06-26 NOTE — Telephone Encounter (Signed)
Yes, please schedule her paracentesis for this week if possible, she does not have f/u appointment with [place], please encourage her to f/u for symptoms management even if she does not want chemo. Thanks   Truitt Merle MD

## 2017-06-26 NOTE — Telephone Encounter (Signed)
"  I am Sierra Beck's son.  We called and have not heard anything about fluid build up in her stomach.  She needs this removed again she's in so much pain.  We also need to figure out a weekly or biweekly schedule for fluid to be drained.  Secondly, my mom has decided she wants to proceed with chemotherapy with the port-a-cath being placed.  We do not have a surgeon so whoever Dr. Burr Medico wants to use is fine. Will her nurse or Dr. Burr Medico call us about chemotherapy?  Return number [phone]."  Routing call information to collaborative nurse and provider for review.  Further patient communication through collaborative nurse. This nurse provided Paracentesis appointment information to arrive to [place] [facility] tomorrow  October 4th at 12:45 pm.  Provider may proceed with treatment and port orders.  This call information routed, next call may be from IR.

## 2017-06-26 NOTE — Telephone Encounter (Signed)
Pt called requesting a call back from nurse for increased pain.  Spoke with pt, and was informed that pt has had increased whole abdominal pain.   Pt uses Fentanyl patch, but does not know when to change the patch - her son knows when it was applied.  Instructed pt to ask son to help with changing patch when it is due.  Pt also takes Dilaudid 4 mg this am with minimal relief.  Denied shortness of breath - stating that she feels like needing paracentesis. Stated last BMs was 3 days ago.  Eating and drinking fine.  Instructed pt to take Dulcolax suppository now for constipation.   Pt also can take Ativan for anxiety as needed. Informed pt that Dr. Burr Medico will be notified.   Pt will be contacted with further instructions from md. Pt's    Phone    [phone].

## 2017-06-27 ENCOUNTER — Ambulatory Visit (HOSPITAL_COMMUNITY)
Admission: RE | Admit: 2017-06-27 | Discharge: 2017-06-27 | Disposition: A | Discharge status: Home / Self Care | Source: Ambulatory Visit | Attending: Hematology | Admitting: Hematology

## 2017-06-27 ENCOUNTER — Encounter (HOSPITAL_COMMUNITY): Payer: Self-pay | Admitting: General Surgery

## 2017-06-27 ENCOUNTER — Telehealth: Payer: Self-pay | Admitting: *Deleted

## 2017-06-27 ENCOUNTER — Telehealth: Payer: Self-pay

## 2017-06-27 DIAGNOSIS — R180 Malignant ascites: Secondary | ICD-10-CM | POA: Insufficient documentation

## 2017-06-27 DIAGNOSIS — C786 Secondary malignant neoplasm of retroperitoneum and peritoneum: Secondary | ICD-10-CM

## 2017-06-27 DIAGNOSIS — C259 Malignant neoplasm of pancreas, unspecified: Secondary | ICD-10-CM

## 2017-06-27 DIAGNOSIS — C799 Secondary malignant neoplasm of unspecified site: Secondary | ICD-10-CM

## 2017-06-27 DIAGNOSIS — C801 Malignant (primary) neoplasm, unspecified: Secondary | ICD-10-CM

## 2017-06-27 HISTORY — PX: IR PARACENTESIS: IMG2679

## 2017-06-27 MED ORDER — LIDOCAINE HCL 1 % IJ SOLN
INTRAMUSCULAR | Status: AC
  Filled 2017-06-27: qty 20

## 2017-06-27 MED ORDER — LIDOCAINE HCL (PF) 1 % IJ SOLN
INTRAMUSCULAR | Status: DC | PRN
  Administered 2017-06-27: 10 mL

## 2017-06-27 NOTE — Procedures (Signed)
Ultrasound-guided therapeutic paracentesis performed yielding 2.4 liters of serous colored fluid. No immediate complications.  Sierra Beck E 3:02 PM 06/27/2017

## 2017-06-27 NOTE — Telephone Encounter (Signed)
FYI, Triage returned call today for constipation reported yesterday.  "My bowels are not great but yesterday I used a suppository.  A little result of watery bowels with matter.  No balls or logs, soft matter.  I am taking laxative and stool softener.  I believe the pain and abdominal swelling is from the fluid in my abdomen.  Do I still go for the paracentesis today?"  Yes, proceed with paracentesis scheduled 12:45 pm today at [facility].  This nurse following up on yesterday's conversation of constipation with a different [place] nurse for any needs or interventions.  Suppository helped somewhat however continue daily laxatives for bowel regimen as ordered.  Goal to have at least one BM every three days.  Call office at any time.  After hours call nurse team available.  Please drink at least 64 oz water daily, hot beverages very helpful.  Activity and exercise can help improve bowel activity.  Try walking three times a day for 5-10 minutes around/throughout your home is a minimal but helpful activity if tolerable.   Denies any further questions.

## 2017-06-27 NOTE — Telephone Encounter (Signed)
Caryl Pina from  [facility] called to inform nurse re: 1.    Caryl Pina attempted to give pt instructions for port placement on  07/01/17 today while pt in radiology. 2.    Pt and family declined port procedure - stating that the outcome will not be any better.  Pt and family just want to         Have Dr. Burr Medico to assist with pain management at this time.   Pt and family declined chemotheray at present. 3.    Madilyn Fireman appts for pt to see Dr. Burr Medico on  06/28/17 to discuss further. Schedule message sent.

## 2017-06-27 NOTE — Telephone Encounter (Signed)
Canged appointment time per RN. Request 10/4 sch message

## 2017-06-27 NOTE — Telephone Encounter (Signed)
Attempted to call pt to give her instructions for port insertion appt; however, pt is currently in a restaurant.  Asked pt to call nurse back when she gets home.  Pt voiced understanding. Spoke with Gildardo Pounds @ [facility]  For port insertion prior to 07/02/17.  Per Caryl Pina : 1.    Pt needs to arrive to  [facility] at  1030 am on Monday  07/01/17.  NPO after  [place]. 2.    Port placement is schedule for  12 pm  07/01/17. 3.    Caryl Pina to give or have radiology tech to give pt instructions along with appt for port 07/01/17 when pt comes in for         Paracentesis today at 1245 pm. [facility]    Phone    [phone].

## 2017-06-28 ENCOUNTER — Ambulatory Visit: Admitting: Hematology

## 2017-06-28 ENCOUNTER — Telehealth: Payer: Self-pay

## 2017-06-28 ENCOUNTER — Other Ambulatory Visit

## 2017-06-28 ENCOUNTER — Telehealth: Payer: Self-pay | Admitting: Hematology

## 2017-06-28 ENCOUNTER — Telehealth: Payer: Self-pay | Admitting: *Deleted

## 2017-06-28 NOTE — Telephone Encounter (Signed)
Received approval and added appointments for lab/fu/chem 10/9 and 10/16. Patient being see by provider today. Spoke with desk nurse and 10/9 and 10/16 appointments will be discussed with patient at visit today.

## 2017-06-28 NOTE — Telephone Encounter (Signed)
I agree with hospice. I will let my nurse to call her later today to see if she needs pleurx and if she wants me to be the attending MD for her hospice. Thanks   Truitt Merle MD

## 2017-06-28 NOTE — Telephone Encounter (Signed)
Left message earlier for pt to call back to discuss Hospice & who attending will be & if she wants a Pleurx cath.

## 2017-06-28 NOTE — Telephone Encounter (Signed)
S/w pt and palliative care people are at her house at time of phone call. She has decided to go with hospice, she is cancelling her appts at [facility] and cancelling the port placement. Told pt that at any time she needs advice from or to see Dr Burr Medico to call.   Will wait for Dr Burr Medico to be aware before cancelling appts.

## 2017-07-01 ENCOUNTER — Other Ambulatory Visit

## 2017-07-01 ENCOUNTER — Ambulatory Visit (HOSPITAL_COMMUNITY): Admission: RE | Admit: 2017-07-01 | Source: Ambulatory Visit

## 2017-07-01 ENCOUNTER — Ambulatory Visit: Admitting: Hematology

## 2017-07-02 ENCOUNTER — Ambulatory Visit: Admitting: Hematology

## 2017-07-02 ENCOUNTER — Ambulatory Visit

## 2017-07-02 ENCOUNTER — Other Ambulatory Visit

## 2017-07-04 ENCOUNTER — Encounter (HOSPITAL_COMMUNITY): Payer: Self-pay | Admitting: Emergency Medicine

## 2017-07-04 ENCOUNTER — Observation Stay (HOSPITAL_COMMUNITY)

## 2017-07-04 ENCOUNTER — Inpatient Hospital Stay (HOSPITAL_COMMUNITY)
Admission: EM | Admit: 2017-07-04 | Discharge: 2017-07-08 | DRG: 374 | Disposition: A | Discharge status: Hospice | Attending: Family Medicine | Admitting: Family Medicine

## 2017-07-04 ENCOUNTER — Emergency Department (HOSPITAL_COMMUNITY)

## 2017-07-04 ENCOUNTER — Ambulatory Visit (HOSPITAL_COMMUNITY): Admission: RE | Admit: 2017-07-04 | Source: Ambulatory Visit

## 2017-07-04 DIAGNOSIS — I10 Essential (primary) hypertension: Secondary | ICD-10-CM | POA: Diagnosis present

## 2017-07-04 DIAGNOSIS — E871 Hypo-osmolality and hyponatremia: Secondary | ICD-10-CM | POA: Diagnosis present

## 2017-07-04 DIAGNOSIS — C220 Liver cell carcinoma: Secondary | ICD-10-CM | POA: Diagnosis present

## 2017-07-04 DIAGNOSIS — E43 Unspecified severe protein-calorie malnutrition: Secondary | ICD-10-CM | POA: Diagnosis present

## 2017-07-04 DIAGNOSIS — C259 Malignant neoplasm of pancreas, unspecified: Secondary | ICD-10-CM | POA: Diagnosis present

## 2017-07-04 DIAGNOSIS — C801 Malignant (primary) neoplasm, unspecified: Secondary | ICD-10-CM | POA: Diagnosis not present

## 2017-07-04 DIAGNOSIS — K5900 Constipation, unspecified: Secondary | ICD-10-CM | POA: Diagnosis not present

## 2017-07-04 DIAGNOSIS — J90 Pleural effusion, not elsewhere classified: Secondary | ICD-10-CM | POA: Diagnosis present

## 2017-07-04 DIAGNOSIS — R109 Unspecified abdominal pain: Secondary | ICD-10-CM

## 2017-07-04 DIAGNOSIS — E119 Type 2 diabetes mellitus without complications: Secondary | ICD-10-CM | POA: Diagnosis not present

## 2017-07-04 DIAGNOSIS — C786 Secondary malignant neoplasm of retroperitoneum and peritoneum: Principal | ICD-10-CM | POA: Diagnosis present

## 2017-07-04 DIAGNOSIS — Z79899 Other long term (current) drug therapy: Secondary | ICD-10-CM

## 2017-07-04 DIAGNOSIS — Z66 Do not resuscitate: Secondary | ICD-10-CM | POA: Diagnosis not present

## 2017-07-04 DIAGNOSIS — R944 Abnormal results of kidney function studies: Secondary | ICD-10-CM | POA: Diagnosis present

## 2017-07-04 DIAGNOSIS — R1084 Generalized abdominal pain: Secondary | ICD-10-CM

## 2017-07-04 DIAGNOSIS — Z515 Encounter for palliative care: Secondary | ICD-10-CM | POA: Diagnosis present

## 2017-07-04 DIAGNOSIS — R112 Nausea with vomiting, unspecified: Secondary | ICD-10-CM | POA: Diagnosis not present

## 2017-07-04 DIAGNOSIS — E785 Hyperlipidemia, unspecified: Secondary | ICD-10-CM | POA: Diagnosis present

## 2017-07-04 DIAGNOSIS — F17210 Nicotine dependence, cigarettes, uncomplicated: Secondary | ICD-10-CM | POA: Diagnosis present

## 2017-07-04 DIAGNOSIS — R180 Malignant ascites: Secondary | ICD-10-CM | POA: Diagnosis present

## 2017-07-04 DIAGNOSIS — Z6827 Body mass index (BMI) 27.0-27.9, adult: Secondary | ICD-10-CM | POA: Diagnosis not present

## 2017-07-04 DIAGNOSIS — G893 Neoplasm related pain (acute) (chronic): Secondary | ICD-10-CM | POA: Diagnosis not present

## 2017-07-04 DIAGNOSIS — E039 Hypothyroidism, unspecified: Secondary | ICD-10-CM | POA: Diagnosis present

## 2017-07-04 LAB — COMPREHENSIVE METABOLIC PANEL
ALK PHOS: 142 U/L — AB (ref 38–126)
ALT: 15 U/L (ref 14–54)
ANION GAP: 15 (ref 5–15)
AST: 20 U/L (ref 15–41)
Albumin: 2.8 g/dL — ABNORMAL LOW (ref 3.5–5.0)
BUN: 29 mg/dL — ABNORMAL HIGH (ref 6–20)
CHLORIDE: 91 mmol/L — AB (ref 101–111)
CO2: 25 mmol/L (ref 22–32)
Calcium: 8.6 mg/dL — ABNORMAL LOW (ref 8.9–10.3)
Creatinine, Ser: 0.88 mg/dL (ref 0.44–1.00)
GFR calc non Af Amer: >60 mL/min (ref >60)
GLUCOSE: 115 mg/dL — AB (ref 65–99)
Potassium: 3.5 mmol/L (ref 3.5–5.1)
SODIUM: 131 mmol/L — AB (ref 135–145)
Total Bilirubin: 1.4 mg/dL — ABNORMAL HIGH (ref 0.3–1.2)
Total Protein: 6.4 g/dL — ABNORMAL LOW (ref 6.5–8.1)

## 2017-07-04 LAB — CBC WITH DIFFERENTIAL/PLATELET
BASOS PCT: 0 %
Basophils Absolute: 0 10*3/uL (ref 0.0–0.1)
EOS ABS: 0.1 10*3/uL (ref 0.0–0.7)
EOS PCT: 1 %
HCT: 36.3 % (ref 36.0–46.0)
HEMOGLOBIN: 12.1 g/dL (ref 12.0–15.0)
LYMPHS ABS: 1.6 10*3/uL (ref 0.7–4.0)
Lymphocytes Relative: 16 %
MCH: 28.5 pg (ref 26.0–34.0)
MCHC: 33.3 g/dL (ref 30.0–36.0)
MCV: 85.6 fL (ref 78.0–100.0)
Monocytes Absolute: 1 10*3/uL (ref 0.1–1.0)
Monocytes Relative: 10 %
NEUTROS PCT: 73 %
Neutro Abs: 7.5 10*3/uL (ref 1.7–7.7)
PLATELETS: 294 10*3/uL (ref 150–400)
RBC: 4.24 MIL/uL (ref 3.87–5.11)
RDW: 14.1 % (ref 11.5–15.5)
WBC: 10.2 10*3/uL (ref 4.0–10.5)

## 2017-07-04 LAB — PROTIME-INR
INR: 1.13
PROTHROMBIN TIME: 14.4 s (ref 11.4–15.2)

## 2017-07-04 MED ORDER — SODIUM CHLORIDE 0.9 % IV SOLN
INTRAVENOUS | Status: AC
  Administered 2017-07-04: 06:00:00 via INTRAVENOUS

## 2017-07-04 MED ORDER — PROMETHAZINE HCL 25 MG/ML IJ SOLN
12.5000 mg | Freq: Four times a day (QID) | INTRAMUSCULAR | Status: DC | PRN
  Filled 2017-07-04 (×2): qty 1

## 2017-07-04 MED ORDER — SODIUM CHLORIDE 0.9% FLUSH: 9.0000 mL | INTRAVENOUS | Status: DC | PRN

## 2017-07-04 MED ORDER — ALBUMIN HUMAN 25 % IV SOLN
50.0000 g | Freq: Once | INTRAVENOUS | Status: DC | PRN
  Filled 2017-07-04: qty 200

## 2017-07-04 MED ORDER — DIPHENHYDRAMINE HCL 12.5 MG/5ML PO ELIX: 12.5000 mg | ORAL_SOLUTION | Freq: Four times a day (QID) | ORAL | Status: DC | PRN

## 2017-07-04 MED ORDER — ROSUVASTATIN CALCIUM 10 MG PO TABS
10.0000 mg | ORAL_TABLET | Freq: Every evening | ORAL | Status: DC
  Administered 2017-07-04 – 2017-07-08 (×4): 10 mg via ORAL
  Filled 2017-07-04 (×4): qty 1

## 2017-07-04 MED ORDER — DEXAMETHASONE 0.5 MG PO TABS
1.0000 mg | ORAL_TABLET | Freq: Two times a day (BID) | ORAL | Status: DC
  Administered 2017-07-04 – 2017-07-08 (×8): 1 mg via ORAL
  Filled 2017-07-04 (×10): qty 2

## 2017-07-04 MED ORDER — NALOXONE HCL 0.4 MG/ML IJ SOLN: 0.4000 mg | INTRAMUSCULAR | Status: DC | PRN

## 2017-07-04 MED ORDER — SIMETHICONE 80 MG PO CHEW
80.0000 mg | CHEWABLE_TABLET | Freq: Four times a day (QID) | ORAL | Status: DC | PRN
  Administered 2017-07-05: 80 mg via ORAL
  Filled 2017-07-04: qty 1

## 2017-07-04 MED ORDER — SODIUM CHLORIDE 0.9 % IV BOLUS (SEPSIS)
500.0000 mL | Freq: Once | INTRAVENOUS | Status: AC
  Administered 2017-07-04: 500 mL via INTRAVENOUS

## 2017-07-04 MED ORDER — HALOPERIDOL 0.5 MG PO TABS
0.5000 mg | ORAL_TABLET | Freq: Four times a day (QID) | ORAL | Status: DC | PRN
  Filled 2017-07-04: qty 1

## 2017-07-04 MED ORDER — NICOTINE 21 MG/24HR TD PT24
21.0000 mg | MEDICATED_PATCH | Freq: Every day | TRANSDERMAL | Status: DC
  Administered 2017-07-04 – 2017-07-08 (×5): 21 mg via TRANSDERMAL
  Filled 2017-07-04 (×5): qty 1

## 2017-07-04 MED ORDER — HYDROMORPHONE 1 MG/ML IV SOLN
INTRAVENOUS | Status: DC
  Administered 2017-07-04: 25 mg via INTRAVENOUS
  Administered 2017-07-04: 2 mg via INTRAVENOUS
  Administered 2017-07-05: 25 mg via INTRAVENOUS
  Administered 2017-07-05: 7.5 mg via INTRAVENOUS
  Administered 2017-07-05: 7 mg via INTRAVENOUS
  Administered 2017-07-06: 9 mg via INTRAVENOUS
  Filled 2017-07-04 (×2): qty 25

## 2017-07-04 MED ORDER — SORBITOL 70 % SOLN
30.0000 mL | Freq: Every day | Status: DC | PRN
  Administered 2017-07-07: 30 mL via ORAL
  Filled 2017-07-04 (×2): qty 30

## 2017-07-04 MED ORDER — CEFAZOLIN SODIUM-DEXTROSE 2-4 GM/100ML-% IV SOLN
2.0000 g | INTRAVENOUS | Status: AC
  Administered 2017-07-05: 2 g via INTRAVENOUS
  Filled 2017-07-04: qty 100

## 2017-07-04 MED ORDER — ONDANSETRON HCL 4 MG/2ML IJ SOLN
4.0000 mg | Freq: Four times a day (QID) | INTRAMUSCULAR | Status: DC | PRN
Start: 2017-07-04 — End: 2017-07-04
  Administered 2017-07-04: 4 mg via INTRAVENOUS
  Filled 2017-07-04: qty 2

## 2017-07-04 MED ORDER — HYDROMORPHONE HCL 1 MG/ML IJ SOLN
1.0000 mg | Freq: Once | INTRAMUSCULAR | Status: AC
  Administered 2017-07-04: 1 mg via INTRAVENOUS
  Filled 2017-07-04: qty 1

## 2017-07-04 MED ORDER — LEVOTHYROXINE SODIUM 88 MCG PO TABS
88.0000 ug | ORAL_TABLET | Freq: Every day | ORAL | Status: DC
  Administered 2017-07-04 – 2017-07-08 (×5): 88 ug via ORAL
  Filled 2017-07-04 (×5): qty 1

## 2017-07-04 MED ORDER — HYDROMORPHONE HCL 1 MG/ML IJ SOLN
1.0000 mg | INTRAMUSCULAR | Status: DC | PRN
  Administered 2017-07-04: 2 mg via INTRAVENOUS
  Filled 2017-07-04: qty 2

## 2017-07-04 MED ORDER — ACETAMINOPHEN 650 MG RE SUPP: 650.0000 mg | Freq: Four times a day (QID) | RECTAL | Status: DC | PRN

## 2017-07-04 MED ORDER — FLEET ENEMA 7-19 GM/118ML RE ENEM
1.0000 | ENEMA | Freq: Once | RECTAL | Status: AC
  Administered 2017-07-04: 1 via RECTAL
  Filled 2017-07-04: qty 1

## 2017-07-04 MED ORDER — ENOXAPARIN SODIUM 40 MG/0.4ML ~~LOC~~ SOLN
40.0000 mg | SUBCUTANEOUS | Status: DC
  Administered 2017-07-04: 40 mg via SUBCUTANEOUS
  Filled 2017-07-04: qty 0.4

## 2017-07-04 MED ORDER — ACETAMINOPHEN 325 MG PO TABS: 650.0000 mg | ORAL_TABLET | Freq: Four times a day (QID) | ORAL | Status: DC | PRN

## 2017-07-04 MED ORDER — ONDANSETRON HCL 4 MG/2ML IJ SOLN
4.0000 mg | Freq: Four times a day (QID) | INTRAMUSCULAR | Status: DC | PRN
  Administered 2017-07-06: 4 mg via INTRAVENOUS

## 2017-07-04 MED ORDER — MAGNESIUM HYDROXIDE 400 MG/5ML PO SUSP: 30.0000 mL | Freq: Every day | ORAL | Status: DC | PRN

## 2017-07-04 MED ORDER — DIPHENHYDRAMINE HCL 50 MG/ML IJ SOLN: 12.5000 mg | Freq: Four times a day (QID) | INTRAMUSCULAR | Status: DC | PRN

## 2017-07-04 MED ORDER — LORAZEPAM 0.5 MG PO TABS
0.5000 mg | ORAL_TABLET | ORAL | Status: DC | PRN
  Filled 2017-07-04: qty 1

## 2017-07-04 MED ORDER — PRAMIPEXOLE DIHYDROCHLORIDE 0.125 MG PO TABS
0.1250 mg | ORAL_TABLET | Freq: Every day | ORAL | Status: DC
  Administered 2017-07-04 – 2017-07-07 (×4): 0.125 mg via ORAL
  Filled 2017-07-04 (×5): qty 1

## 2017-07-04 MED ORDER — MAGNESIUM CITRATE PO SOLN: 1.0000 | Freq: Once | ORAL | Status: DC | PRN

## 2017-07-04 MED ORDER — HYDROMORPHONE HCL-NACL 0.5-0.9 MG/ML-% IV SOSY: 1.0000 mg | PREFILLED_SYRINGE | INTRAVENOUS | Status: DC | PRN

## 2017-07-04 MED ORDER — ONDANSETRON HCL 4 MG PO TABS: 4.0000 mg | ORAL_TABLET | Freq: Four times a day (QID) | ORAL | Status: DC | PRN

## 2017-07-04 MED ORDER — ENOXAPARIN SODIUM 40 MG/0.4ML ~~LOC~~ SOLN
40.0000 mg | SUBCUTANEOUS | Status: DC
  Administered 2017-07-05 – 2017-07-07 (×3): 40 mg via SUBCUTANEOUS
  Filled 2017-07-04 (×3): qty 0.4

## 2017-07-04 MED ORDER — HYDROMORPHONE HCL 2 MG/ML IJ SOLN
INTRAMUSCULAR | Status: AC
  Administered 2017-07-04: 2 mg
  Filled 2017-07-04: qty 1

## 2017-07-04 MED ORDER — LORAZEPAM 0.5 MG PO TABS
0.5000 mg | ORAL_TABLET | Freq: Two times a day (BID) | ORAL | Status: DC
  Administered 2017-07-04 – 2017-07-08 (×8): 0.5 mg via ORAL
  Filled 2017-07-04 (×8): qty 1

## 2017-07-04 MED ORDER — ONDANSETRON HCL 4 MG/2ML IJ SOLN
4.0000 mg | Freq: Once | INTRAMUSCULAR | Status: AC
  Administered 2017-07-04: 4 mg via INTRAVENOUS
  Filled 2017-07-04: qty 2

## 2017-07-04 MED ORDER — POLYETHYLENE GLYCOL 3350 17 G PO PACK
17.0000 g | PACK | Freq: Two times a day (BID) | ORAL | Status: DC
  Administered 2017-07-04 – 2017-07-08 (×7): 17 g via ORAL
  Filled 2017-07-04 (×8): qty 1

## 2017-07-04 MED ORDER — HYDROMORPHONE HCL 1 MG/ML IJ SOLN
2.0000 mg | Freq: Once | INTRAMUSCULAR | Status: AC
  Administered 2017-07-04: 2 mg via INTRAVENOUS
  Filled 2017-07-04: qty 2

## 2017-07-04 MED ORDER — SENNA 8.6 MG PO TABS
2.0000 | ORAL_TABLET | Freq: Two times a day (BID) | ORAL | Status: DC
  Administered 2017-07-04 – 2017-07-08 (×8): 17.2 mg via ORAL
  Filled 2017-07-04 (×9): qty 2

## 2017-07-04 MED ORDER — PANTOPRAZOLE SODIUM 20 MG PO TBEC
20.0000 mg | DELAYED_RELEASE_TABLET | Freq: Every day | ORAL | Status: DC
  Administered 2017-07-04 – 2017-07-08 (×4): 20 mg via ORAL
  Filled 2017-07-04 (×5): qty 1

## 2017-07-04 MED ORDER — METOCLOPRAMIDE HCL 10 MG PO TABS
10.0000 mg | ORAL_TABLET | Freq: Two times a day (BID) | ORAL | Status: DC
  Administered 2017-07-04 – 2017-07-06 (×4): 10 mg via ORAL
  Filled 2017-07-04 (×5): qty 1

## 2017-07-04 MED ORDER — FLEET ENEMA 7-19 GM/118ML RE ENEM: 1.0000 | ENEMA | Freq: Every day | RECTAL | Status: DC | PRN

## 2017-07-04 MED ORDER — HYDROMORPHONE HCL 2 MG/ML IJ SOLN
INTRAMUSCULAR | Status: AC
  Filled 2017-07-04: qty 1

## 2017-07-04 MED ORDER — ENSURE ENLIVE PO LIQD
237.0000 mL | Freq: Two times a day (BID) | ORAL | Status: DC
  Administered 2017-07-06: 237 mL via ORAL

## 2017-07-04 NOTE — ED Triage Notes (Signed)
Pt coming from home with complaints of nausea and vomiting x 5 days. She is a hospice patient. Has Hx of pancreatic cancer. Had pain pump placed earlier today and is now having severe pain in upper left shoulder area where pump was placed. A&O x4. Pt was given 157mcg of Fentanyl and 8mg  of Zofran by EMS prior to arrival. Pt requesting pain pump be removed.
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
## 2017-07-04 NOTE — Progress Notes (Signed)
[facility] requesting picc line be placed, so that patient could go home on dilauidid drip.
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
## 2017-07-04 NOTE — Progress Notes (Signed)
Initial Nutrition Assessment  DOCUMENTATION CODES:   Severe malnutrition in context of acute illness/injury  INTERVENTION:   - Continue Ensure Enlive BID when appropriate. Each supplement provides 350 kcals and 20 grams of protein.   NUTRITION DIAGNOSIS:   Malnutrition (severe) related to acute illness (recent dx metastatic ca, n/v) as evidenced by percent weight loss, energy intake < or equal to 50% for > or equal to 5 days.  GOAL:   Patient will meet greater than or equal to 90% of their needs   MONITOR:   Diet advancement, PO intake, Supplement acceptance, Weight trends, Labs  REASON FOR ASSESSMENT:   Malnutrition Screening Tool    ASSESSMENT:   Pt is a 61 year old female who was recently diagnosed with metastatic cancer, likely pancreatic. Has peritoneal carcinomatosis and has had two previous paracenteses. Pt presents with n.v, and uncontrolled abdominal pain. PMH of HRN, hypothyroidism, and DM.   Pt has elected to use hospice care upon discharge.  Pt reports that within the the past couple of weeks eating anything makes her abdominal pain worse and causes n/v. She states that everything tastes bad and when she looks in her refrigerator, nothing seems appetizing. Pt has also had difficulty drinking fluids other than water and feels that everything causes her to become nauseous and vomit.   Pt refused physical exam based on recently receiving an enema and frequently going to the bathroom. From observation of face, clavicle, and arms pt did not appear to have wasting in those regions.  When asked about weight changes, pt reported that she is unsure if she's lost weight and hasn't noticed her clothes fitting differently. Per weight records in chart, pt weighed 149 on 10/11, 163 on 9/27, and 174 on 9/23. This is a 14% weight loss in less than one month, severe for time frame.    Pt is NPO for her procedure today.  Medications: Reglan, Lovenox, Protonix, Miralax  Labs: Na  131 (L), Glucose 115 (H), BUN 29 (H), Ca 8.6 (H)  Diet Order:  Diet NPO time specified Except for: Sips with Meds  Skin:  Reviewed, no issues  Last BM:  06/19/17  Height:   Ht Readings from Last 1 Encounters:  07/04/17 5\' 2"  (1.575 m)    Weight:   Wt Readings from Last 1 Encounters:  07/04/17 149 lb 14.6 oz (68 kg)    Ideal Body Weight:  50 kg  BMI:  Body mass index is 27.42 kg/m.  Estimated Nutritional Needs:   Kcal:  1500-1700 kcals  Protein:  75-85 grams protein  Fluid:  >/= 2 L  EDUCATION NEEDS:   No education needs identified at this time  [place] Intern Pager: [phone] 07/04/2017 2:15 PM

## 2017-07-04 NOTE — Progress Notes (Signed)
577.9 ml's of Fentanyl solution wasted in sink by Claudette Stapler from [facility].  CAD pump sent with Nunzio Cory from [place].

## 2017-07-04 NOTE — Progress Notes (Signed)
Patient seen and evaluated earlier this am by my associate. Please refer to H and P for details regarding history, assessment, and plan.  Palliative care assisting with symptom/pain management.  We'll plan on reassessing next a.m.  Gen.: Patient in no acute distress Cardiovascular: No cyanosis Pulmonary: No increased work of breathing  We'll plan on reassessing next a.m.  Sierra Beck, [employer]

## 2017-07-04 NOTE — ED Notes (Signed)
Bed: RESA Expected date:  Expected time:  Means of arrival:  Comments: EMS 61 yo female from home/pancreatic cancer-pain pump installed-burning-severe abdominal pain
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
## 2017-07-04 NOTE — Care Management Note (Signed)
Case Management Note  Patient Details  Name: Sierra Beck MRN: [id number] Date of Birth: [birth year]  Subjective/Objective:                  abd pain and emesis Action/Plan: Date:  July 04, 2017 Chart reviewed for concurrent status and case management needs.  Will continue to follow patient progress.  Discharge Planning: following for needs  Expected discharge date: [id number]  Velva Harman, BSN, [place], [place]   Expected Discharge Date:  07/05/17               Expected Discharge Plan:  Home/Self Care  In-House Referral:     Discharge planning Services  CM Consult  Post Acute Care Choice:    Choice offered to:     DME Arranged:    DME Agency:     HH Arranged:    HH Agency:     Status of Service:  In process, will continue to follow  If discussed at Long Length of Stay Meetings, dates discussed:    Additional Comments:  Leeroy Cha, RN 07/04/2017, 9:16 AM

## 2017-07-05 ENCOUNTER — Inpatient Hospital Stay (HOSPITAL_COMMUNITY)

## 2017-07-05 ENCOUNTER — Encounter (HOSPITAL_COMMUNITY): Payer: Self-pay | Admitting: Interventional Radiology

## 2017-07-05 DIAGNOSIS — G893 Neoplasm related pain (acute) (chronic): Secondary | ICD-10-CM

## 2017-07-05 HISTORY — PX: IR PERC PLEURAL DRAIN W/INDWELL CATH W/IMG GUIDE: IMG5383

## 2017-07-05 HISTORY — PX: IR US GUIDE VASC ACCESS RIGHT: IMG2390

## 2017-07-05 HISTORY — PX: IR FLUORO GUIDE CV LINE RIGHT: IMG2283

## 2017-07-05 MED ORDER — LIDOCAINE HCL 1 % IJ SOLN
INTRAMUSCULAR | Status: AC
  Filled 2017-07-05: qty 20

## 2017-07-05 MED ORDER — MIDAZOLAM HCL 2 MG/2ML IJ SOLN
INTRAMUSCULAR | Status: AC
  Filled 2017-07-05: qty 4

## 2017-07-05 MED ORDER — SODIUM CHLORIDE 0.9% FLUSH
10.0000 mL | INTRAVENOUS | Status: DC | PRN
  Administered 2017-07-06: 20 mL
  Filled 2017-07-05: qty 40

## 2017-07-05 MED ORDER — LIDOCAINE HCL 1 % IJ SOLN
INTRAMUSCULAR | Status: AC | PRN
  Administered 2017-07-05: 10 mL

## 2017-07-05 MED ORDER — HYDROMORPHONE 1 MG/ML IV SOLN: INTRAVENOUS | 0 refills | Status: AC

## 2017-07-05 MED ORDER — FENTANYL CITRATE (PF) 100 MCG/2ML IJ SOLN
INTRAMUSCULAR | Status: AC | PRN
  Administered 2017-07-05 (×2): 50 ug via INTRAVENOUS

## 2017-07-05 MED ORDER — CEFAZOLIN SODIUM-DEXTROSE 2-4 GM/100ML-% IV SOLN
INTRAVENOUS | Status: AC
  Administered 2017-07-05: 2 g via INTRAVENOUS
  Filled 2017-07-05: qty 100

## 2017-07-05 MED ORDER — HYDROMORPHONE 1 MG/ML IV SOLN: INTRAVENOUS | 0 refills | Status: DC

## 2017-07-05 MED ORDER — MIDAZOLAM HCL 2 MG/2ML IJ SOLN
INTRAMUSCULAR | Status: AC | PRN
  Administered 2017-07-05 (×2): 1 mg via INTRAVENOUS

## 2017-07-05 MED ORDER — IOPAMIDOL (ISOVUE-300) INJECTION 61%
INTRAVENOUS | Status: AC
  Administered 2017-07-05: 10 mL
  Filled 2017-07-05: qty 50

## 2017-07-05 MED ORDER — FENTANYL CITRATE (PF) 100 MCG/2ML IJ SOLN
INTRAMUSCULAR | Status: AC
  Filled 2017-07-05: qty 4

## 2017-07-05 MED ORDER — SODIUM CHLORIDE 0.9% FLUSH: 10.0000 mL | INTRAVENOUS | Status: DC | PRN

## 2017-07-05 NOTE — Progress Notes (Addendum)
July 05, 2017 Pluerx cath information completed as much as possible still need ongoing providers name/hospice will see patient for hhc//paper placed on front of the chart and instructions on how to obtain first of bottle given to Shanon Brow the charge nurse and patient's nurse.  Information in the chart and orders are incomplete at this time. [facility]-messagel left for information above. [facility]-will have bottle that are available to the patient please go ahead and get first set/floor nurse Shanon Brow is doing this/willk not need paperwork they have extra bottles for the patient through [place]. Velva Harman, BSN, [place], [place].

## 2017-07-05 NOTE — Progress Notes (Signed)
PROGRESS NOTE    Sierra Beck  TKW:[id number] DOB: 1956/01/13 DOA: 07/04/2017 PCP: Berkley Harvey, NP    Brief Narrative:   61 y.o. female with medical history significant for hypertension, hypothyroidism, and recent diagnosis of metastatic cancer with probable pancreatic primary, now presenting to the emergency department for evaluation of severe abdominal pain, nausea, and vomiting. Patient was admitted to the hospital with severe abdominal pain, nausea, and vomiting about 3 weeks ago, was found to have peritoneal carcinomatosis, masses in the pancreas and liver, and underwent a peritoneal biopsy that suggest a creatinine primary. She was evaluated by oncology, but has now elected for hospice care and canceled her port placement.   Assessment & Plan:   Principal Problem:   Intractable abdominal pain - palliative consulted and patient has dilaudid pca pump and picc line inserted today. Active Problems:   Hypothyroidism   Essential hypertension   Peritoneal carcinomatosis (HCC)   Nausea & vomiting   Abdominal pain, generalized   Primary pancreatic cancer with metastasis to other site [facility])   Ascites, malignant - pleurx catheter placed - palliative assisted with plan   Palliative care encounter   Protein-calorie malnutrition, severe    DVT prophylaxis: Lovenox Code Status: DNR Family Communication: d/c patient and family at bedside Disposition Plan: d/c next am   Consultants:   Hospice   Procedures: None   Antimicrobials: none   Subjective: Pt has no new complaints, no acute issue overnight  Objective: Vitals:   07/05/17 1215 07/05/17 1220 07/05/17 1414 07/05/17 1550  BP: (!) 152/88 (!) 155/83 135/67   Pulse: 76 72 75   Resp: 16 18 18 13   Temp:   98.3 F (36.8 C)   TempSrc:   Oral   SpO2: 100% 100% 93% 93%  Weight:      Height:        Intake/Output Summary (Last 24 hours) at 07/05/17 1613 Last data filed at 07/05/17 1200  Gross per 24 hour  Intake           2265.75 ml  Output                0 ml  Net          2265.75 ml   Filed Weights   07/04/17 0116 07/04/17 0532  Weight: 81.6 kg (180 lb) 68 kg (149 lb 14.6 oz)    Examination:  General exam: Appears calm and comfortable  Respiratory system: equal chest rise. Respiratory effort normal. Cardiovascular system: no cyanosis Gastrointestinal system: Abdomen is nondistended, soft and no guarding Central nervous system: Alert and oriented. No focal neurological deficits. Extremities: Symmetric 5 x 5 power. Skin: No rashes, lesions or ulcers, picc line in place Psychiatry: Judgement and insight appear normal. Mood & affect appropriate.     Data Reviewed: I have personally reviewed following labs and imaging studies  CBC:  Recent Labs Lab 07/04/17 0237  WBC 10.2  NEUTROABS 7.5  HGB 12.1  HCT 36.3  MCV 85.6  PLT 924   Basic Metabolic Panel:  Recent Labs Lab 07/04/17 0237  NA 131*  K 3.5  CL 91*  CO2 25  GLUCOSE 115*  BUN 29*  CREATININE 0.88  CALCIUM 8.6*   GFR: Estimated Creatinine Clearance: 60 mL/min (by C-G formula based on SCr of 0.88 mg/dL). Liver Function Tests:  Recent Labs Lab 07/04/17 0237  AST 20  ALT 15  ALKPHOS 142*  BILITOT 1.4*  PROT 6.4*  ALBUMIN 2.8*   No results  for input(s): LIPASE, AMYLASE in the last 168 hours. No results for input(s): AMMONIA in the last 168 hours. Coagulation Profile:  Recent Labs Lab 07/04/17 1106  INR 1.13   Cardiac Enzymes: No results for input(s): CKTOTAL, CKMB, CKMBINDEX, TROPONINI in the last 168 hours. BNP (last 3 results) No results for input(s): PROBNP in the last 8760 hours. HbA1C: No results for input(s): HGBA1C in the last 72 hours. CBG: No results for input(s): GLUCAP in the last 168 hours. Lipid Profile: No results for input(s): CHOL, HDL, LDLCALC, TRIG, CHOLHDL, LDLDIRECT in the last 72 hours. Thyroid Function Tests: No results for input(s): TSH, T4TOTAL, FREET4, T3FREE, THYROIDAB in  the last 72 hours. Anemia Panel: No results for input(s): VITAMINB12, FOLATE, FERRITIN, TIBC, IRON, RETICCTPCT in the last 72 hours. Sepsis Labs: No results for input(s): PROCALCITON, LATICACIDVEN in the last 168 hours.  No results found for this or any previous visit (from the past 240 hour(s)).       Radiology Studies: Ir Fluoro Guide Cv Line Right  Result Date: 07/05/2017 INDICATION: 61 year old female with malignant ascites secondary to pancreatic adenocarcinoma. She presents for placement of a tunneled peritoneal drainage catheter as well as placement of a PICC. EXAM: IR PERC PLEURAL DRAIN W/INDWELL CATH W/IMG GUIDE; IR RIGHT FLOURO GUIDE CV LINE; IR ULTRASOUND GUIDANCE VASC ACCESS RIGHT MEDICATIONS: 2 g Ancef were administered intravenously within 1 hour of skin incision. ANESTHESIA/SEDATION: Fentanyl 100 mcg IV; Versed 2 mg IV Moderate Sedation Time:  37 minutes The patient was continuously monitored during the procedure by the interventional radiology nurse under my direct supervision. FLUOROSCOPY TIME:  5 minutes 42 seconds for a total of 85.2 mGy COMPLICATIONS: None immediate. PROCEDURE: Informed written consent was obtained from the patient after a thorough discussion of the procedural risks, benefits and alternatives. All questions were addressed. Maximal Sterile Barrier Technique was utilized including caps, mask, sterile gowns, sterile gloves, sterile drape, hand hygiene and skin antiseptic. A timeout was performed prior to the initiation of the procedure. PICC PLACEMENT Attention was first turned to the right arm. The medial aspect of the right upper arm was sterilely prepped and draped in standard fashion using chlorhexidine skin prep. A tourniquet was applied. The arm was interrogated with ultrasound. The brachial vein is widely patent. An image was obtained and stored for the medical record. Local anesthesia was attained by infiltration with 1% lidocaine. Under real-time sonographic  guidance, the right brachial vein was punctured using a 21 gauge micropuncture needle. A wire was advanced into the central venous vasculature in the needle was exchanged for a peel-away sheath. Unfortunately, the wire could not be navigated into the heart. The wire continued to buckle superiorly into the internal jugular vein. Fear Ing a central venous occlusion, and limited right upper extremity venogram was performed. The subclavian vein and superior vena cava are patent. The anatomy is just highly tortuous. Therefore, a 5 French angled catheter was advanced through the peel-away sheath an used to navigate the wire into the right heart. A 5 French dual-lumen power injectable PICC was then cut to 33 cm in advanced over the wire. The tip of the catheter is positioned at the superior cavoatrial junction. The catheter flushes and aspirates was ease. It was flushed with heparinized saline and secured to the skin with an adhesive fixation device. PLEUR-X PLACEMENT Attention was next turned to the abdomen. The right abdomen was interrogated with ultrasound. There is moderate volume ascites. A suitable skin entry site was selected and marked.  Following sterile prep and draped using chlorhexidine skin prep, local anesthesia was attained by infiltration with 1% lidocaine. A small dermatotomy was made. An 18 gauge sheath needle was then advanced through the dermatotomy and into the peritoneal space with return of yellow ascitic fluid. A 0.035 inch wire was then advanced through the sheath needle and into the peritoneal cavity. A small dermatotomy was made approximately 5 cm medial and slightly superior to the peritoneal entry site. This will serve as the catheter exit site. The PleurX catheter was then tunneled retrograde from the skin exit site to the dermatotomy overlying the peritoneal access site. A peel-away sheath was then advanced over the wire and into the peritoneal space. The PleurX catheter was advanced through  the peel-away sheath and the peel-away sheath was discarded. The catheter was connected to suction and a paracentesis was performed removing 1.6 L of fluid. The catheter was secured to the skin with 2-0 Prolene suture. The dermatotomy overlying the peritoneal access site was closed with an inverted interrupted 3-0 Vicryl suture in the epidermis sealed with derma bond. The patient tolerated the procedure well. IMPRESSION: 1. Placement of a right arm dual-lumen power PICC. The catheter tip is located at the superior cavoatrial junction. 2. Placement of a right abdominal tunneled peritoneal drainage catheter. Electronically Signed   By: Jacqulynn Cadet M.D.   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 She presents for placement of a tunneled peritoneal drainage catheter as well as placement of a PICC. EXAM: IR PERC PLEURAL DRAIN W/INDWELL CATH W/IMG GUIDE; IR RIGHT FLOURO GUIDE CV LINE; IR ULTRASOUND GUIDANCE VASC ACCESS RIGHT MEDICATIONS: 2 g Ancef were administered intravenously within 1 hour of skin incision. ANESTHESIA/SEDATION: Fentanyl 100 mcg IV; Versed 2 mg IV Moderate Sedation Time:  37 minutes The patient was continuously monitored during the procedure by the interventional radiology nurse under my direct supervision. FLUOROSCOPY TIME:  5 minutes 42 seconds for a total of 96.2 mGy COMPLICATIONS: None immediate. PROCEDURE: Informed written consent was obtained from the patient after a thorough discussion of the procedural risks, benefits and alternatives. All questions were addressed. Maximal Sterile Barrier Technique was utilized including caps, mask, sterile gowns, sterile gloves, sterile drape, hand hygiene and skin antiseptic. A timeout was performed prior to the initiation of the procedure. PICC PLACEMENT Attention was first turned to the right arm. The medial aspect of  the right upper arm was sterilely prepped and draped in standard fashion using chlorhexidine skin prep. A tourniquet was applied. The arm was interrogated with ultrasound. The brachial vein is widely patent. An image was obtained and stored for the medical record. Local anesthesia was attained by infiltration with 1% lidocaine. Under real-time sonographic guidance, the right brachial vein was punctured using a 21 gauge micropuncture needle. A wire was advanced into the central venous vasculature in the needle was exchanged for a peel-away sheath. Unfortunately, the wire could not be navigated into the heart. The wire continued to buckle superiorly into the internal jugular vein. Fear Ing a central venous occlusion, and limited right upper extremity venogram was performed. The subclavian vein and superior vena cava are patent. The anatomy is just highly tortuous. Therefore, a 5 French angled catheter was advanced through the peel-away sheath an used to navigate the wire into the right heart. A 5 French dual-lumen power injectable PICC was then cut to 33 cm in advanced over  the wire. The tip of the catheter is positioned at the superior cavoatrial junction. The catheter flushes and aspirates was ease. It was flushed with heparinized saline and secured to the skin with an adhesive fixation device. PLEUR-X PLACEMENT Attention was next turned to the abdomen. The right abdomen was interrogated with ultrasound. There is moderate volume ascites. A suitable skin entry site was selected and marked. Following sterile prep and draped using chlorhexidine skin prep, local anesthesia was attained by infiltration with 1% lidocaine. A small dermatotomy was made. An 18 gauge sheath needle was then advanced through the dermatotomy and into the peritoneal space with return of yellow ascitic fluid. A 0.035 inch wire was then advanced through the sheath needle and into the peritoneal cavity. A small dermatotomy was made approximately 5  cm medial and slightly superior to the peritoneal entry site. This will serve as the catheter exit site. The PleurX catheter was then tunneled retrograde from the skin exit site to the dermatotomy overlying the peritoneal access site. A peel-away sheath was then advanced over the wire and into the peritoneal space. The PleurX catheter was advanced through the peel-away sheath and the peel-away sheath was discarded. The catheter was connected to suction and a paracentesis was performed removing 1.6 L of fluid. The catheter was secured to the skin with 2-0 Prolene suture. The dermatotomy overlying the peritoneal access site was closed with an inverted interrupted 3-0 Vicryl suture in the epidermis sealed with derma bond. The patient tolerated the procedure well. IMPRESSION: 1. Placement of a right arm dual-lumen power PICC. The catheter tip is located at the superior cavoatrial junction. 2. Placement of a right abdominal tunneled peritoneal drainage catheter. Electronically Signed   By: Jacqulynn Cadet M.D.   On: 07/05/2017 14:11   Dg Abd Acute W/chest  Result Date: 07/04/2017 CLINICAL DATA:  Vomiting and abdominal pain EXAM: DG ABDOMEN ACUTE W/ 1V CHEST COMPARISON:  06/14/2017 FINDINGS: Nodularity in the right base, indeterminate. Left lung is clear. Pulmonary vasculature is normal. Heart size is normal. Hilar and mediastinal contours are unremarkable. Supine and left lateral decubitus views of the abdomen are negative for bowel obstruction or perforation. No biliary or urinary calculi are evident. IMPRESSION: 1. Nonspecific mild nodularity in the right base, indeterminate. This could be infectious or neoplastic. 2. No acute findings in the abdomen. Electronically Signed   By: Andreas Newport M.D.   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 She presents for  placement of a tunneled peritoneal drainage catheter as well as placement of a PICC. EXAM: IR PERC PLEURAL DRAIN W/INDWELL CATH W/IMG GUIDE; IR RIGHT FLOURO GUIDE CV LINE; IR ULTRASOUND GUIDANCE VASC ACCESS RIGHT MEDICATIONS: 2 g Ancef were administered intravenously within 1 hour of skin incision. ANESTHESIA/SEDATION: Fentanyl 100 mcg IV; Versed 2 mg IV Moderate Sedation Time:  37 minutes The patient was continuously monitored during the procedure by the interventional radiology nurse under my direct supervision. FLUOROSCOPY TIME:  5 minutes 42 seconds for a total of 40.9 mGy COMPLICATIONS: None immediate. PROCEDURE: Informed written consent was obtained from the patient after a thorough discussion of the procedural risks, benefits and alternatives. All questions were addressed. Maximal Sterile Barrier Technique was utilized including caps, mask, sterile gowns, sterile gloves, sterile drape, hand hygiene and skin antiseptic. A timeout was performed prior to the initiation of the procedure. PICC PLACEMENT Attention was first turned  to the right arm. The medial aspect of the right upper arm was sterilely prepped and draped in standard fashion using chlorhexidine skin prep. A tourniquet was applied. The arm was interrogated with ultrasound. The brachial vein is widely patent. An image was obtained and stored for the medical record. Local anesthesia was attained by infiltration with 1% lidocaine. Under real-time sonographic guidance, the right brachial vein was punctured using a 21 gauge micropuncture needle. A wire was advanced into the central venous vasculature in the needle was exchanged for a peel-away sheath. Unfortunately, the wire could not be navigated into the heart. The wire continued to buckle superiorly into the internal jugular vein. Fear Ing a central venous occlusion, and limited right upper extremity venogram was performed. The subclavian vein and superior vena cava are patent. The anatomy is just  highly tortuous. Therefore, a 5 French angled catheter was advanced through the peel-away sheath an used to navigate the wire into the right heart. A 5 French dual-lumen power injectable PICC was then cut to 33 cm in advanced over the wire. The tip of the catheter is positioned at the superior cavoatrial junction. The catheter flushes and aspirates was ease. It was flushed with heparinized saline and secured to the skin with an adhesive fixation device. PLEUR-X PLACEMENT Attention was next turned to the abdomen. The right abdomen was interrogated with ultrasound. There is moderate volume ascites. A suitable skin entry site was selected and marked. Following sterile prep and draped using chlorhexidine skin prep, local anesthesia was attained by infiltration with 1% lidocaine. A small dermatotomy was made. An 18 gauge sheath needle was then advanced through the dermatotomy and into the peritoneal space with return of yellow ascitic fluid. A 0.035 inch wire was then advanced through the sheath needle and into the peritoneal cavity. A small dermatotomy was made approximately 5 cm medial and slightly superior to the peritoneal entry site. This will serve as the catheter exit site. The PleurX catheter was then tunneled retrograde from the skin exit site to the dermatotomy overlying the peritoneal access site. A peel-away sheath was then advanced over the wire and into the peritoneal space. The PleurX catheter was advanced through the peel-away sheath and the peel-away sheath was discarded. The catheter was connected to suction and a paracentesis was performed removing 1.6 L of fluid. The catheter was secured to the skin with 2-0 Prolene suture. The dermatotomy overlying the peritoneal access site was closed with an inverted interrupted 3-0 Vicryl suture in the epidermis sealed with derma bond. The patient tolerated the procedure well. IMPRESSION: 1. Placement of a right arm dual-lumen power PICC. The catheter tip is  located at the superior cavoatrial junction. 2. Placement of a right abdominal tunneled peritoneal drainage catheter. Electronically Signed   By: Jacqulynn Cadet M.D.   On: 07/05/2017 14:11        Scheduled Meds: . dexamethasone  1 mg Oral Q12H  . enoxaparin (LOVENOX) injection  40 mg Subcutaneous Q24H  . feeding supplement (ENSURE ENLIVE)  237 mL Oral BID BM  . fentaNYL      . HYDROmorphone   Intravenous Q4H  . levothyroxine  88 mcg Oral QAC breakfast  . lidocaine      . LORazepam  0.5 mg Oral BID  . metoCLOPramide  10 mg Oral BID  . midazolam      . nicotine  21 mg Transdermal Daily  . pantoprazole  20 mg Oral Daily  . polyethylene glycol  17 g Oral  BID  . pramipexole  0.125 mg Oral QHS  . rosuvastatin  10 mg Oral QPM  . senna  2 tablet Oral BID   Continuous Infusions: . albumin human       LOS: 1 day    Time spent: > 15 minutes   Velvet Bathe, MD [facility] Pager [phone]  If 7PM-7AM, please contact night-coverage [email or web address] Password [email or web address] 07/05/2017, 4:13 PM

## 2017-07-05 NOTE — Procedures (Signed)
Interventional Radiology Procedure Note  Procedure:  1.) Placement of a 33 cm RIGHT arm DL PowerPICC.  Catheter tip at cavoatrial junction and ready for use.  2.) Placement of a right sided tunneled peritoneal drainage catheter  Complications: None  Estimated Blood Loss: <25 mL  Recommendations: - Routine line care  Signed,  Criselda Peaches, MD

## 2017-07-05 NOTE — Progress Notes (Signed)
Daily Progress Note   Patient Name: Sierra Beck       Date: 07/05/2017 DOB: 06/10/1956  Age: 61 y.o. MRN#: [id number] Attending Physician: Velvet Bathe, MD Primary Care Physician: Berkley Harvey, NP Admit Date: 07/04/2017  Reason for Consultation/Follow-up: Non pain symptom management and Pain control  Subjective: I met today with Sierra Beck and she reports having much better pain control on dilaudid PCA.  She also feels that her pain will be better once she goes for Pleurex catheter placement.  Length of Stay: 1  Current Medications: Scheduled Meds:  . dexamethasone  1 mg Oral Q12H  . enoxaparin (LOVENOX) injection  40 mg Subcutaneous Q24H  . feeding supplement (ENSURE ENLIVE)  237 mL Oral BID BM  . HYDROmorphone   Intravenous Q4H  . levothyroxine  88 mcg Oral QAC breakfast  . LORazepam  0.5 mg Oral BID  . metoCLOPramide  10 mg Oral BID  . nicotine  21 mg Transdermal Daily  . pantoprazole  20 mg Oral Daily  . polyethylene glycol  17 g Oral BID  . pramipexole  0.125 mg Oral QHS  . rosuvastatin  10 mg Oral QPM  . senna  2 tablet Oral BID    Continuous Infusions: . albumin human    .  ceFAZolin (ANCEF) IV      PRN Meds: acetaminophen **OR** acetaminophen, albumin human, diphenhydrAMINE **OR** diphenhydrAMINE, haloperidol, HYDROmorphone (DILAUDID) injection, LORazepam, naloxone **AND** sodium chloride flush, ondansetron (ZOFRAN) IV, ondansetron **OR** [DISCONTINUED] ondansetron (ZOFRAN) IV, promethazine, simethicone, sodium phosphate, sorbitol  Physical Exam         Well developed female, Alert, orientated, coherent, in minimal distress due to nausea and pain CV rrr Resp no distress, no w/c/r Abdomen distended but not firm, not tender to light palpation, palpable mass in  central abdomen   Vital Signs: BP 115/66 (BP Location: Right Arm)   Pulse 84   Temp 98.3 F (36.8 C) (Oral)   Resp 11   Ht _0  (1.575 m)   Wt 68 kg (149 lb 14.6 oz)   SpO2 95%   BMI 27.42 kg/m  SpO2: SpO2: 95 % O2 Device: O2 Device: Not Delivered O2 Flow Rate: O2 Flow Rate (L/min): 0 L/min  Intake/output summary:  Intake/Output Summary (Last 24 hours) at 07/05/17 1027 Last data filed at  07/05/17 0600  Gross per 24 hour  Intake          1785.75 ml  Output                0 ml  Net          1785.75 ml   LBM: Last BM Date: 07/04/17 Baseline Weight: Weight: 81.6 kg (180 lb) Most recent weight: Weight: 68 kg (149 lb 14.6 oz)       Palliative Assessment/Data:      Patient Active Problem List   Diagnosis Date Noted  . Protein-calorie malnutrition, severe 07/04/2017  . Ascites, malignant   . Palliative care encounter   . Primary pancreatic cancer with metastasis to other site ([place]) 06/19/2017  . Encounter for palliative care   . Pancreatic mass   . Constipation   . Palliative care by specialist   . Abdominal pain, generalized   . Peritoneal carcinomatosis ([place]) 06/11/2017  . Nausea & vomiting 06/11/2017  . Intractable abdominal pain 06/11/2017  . HYPERLIPIDEMIA 10/21/2007  . Hypothyroidism 02/24/2007  . HYPERLIPIDEMIA 02/24/2007  . Essential hypertension 02/24/2007    Palliative Care Assessment & Plan   Patient Profile: 61 y.o. female  with past medical history of recently diagnosed metastatic cancer, diabetes, and thyroid disease who was admitted on 07/04/2017 with nausea, vomiting, and abdominal pain.  Sierra Beck is a patient with [facility].  Recommendations/Plan:  Pain: PCA use reviewed.  Has used 98m since initiation.  Has been averaging 0.5-112mper hour.  She will benefit from PICC line and discharge home with dilaudid PCA.  [facility] and they are in agreement with this plan.  She will likely eventually need a basal rate,  however, I did not start at this time until we can judge her needs following pleurex placement and better management of her ascites.  I will reach out to [facility] assist in facilitating PCA.  Nausea: Likely related to ascites.  Agree with plan for pleurex catheter  Goals of Care and Additional Recommendations:  Limitations on Scope of Treatment: Full Comfort Care  Code Status:    Code Status Orders        Start     Ordered   07/04/17 0418  Do not attempt resuscitation (DNR)  Continuous    Question Answer Comment  In the event of cardiac or respiratory ARREST Do not call a "code blue"   In the event of cardiac or respiratory ARREST Do not perform Intubation, CPR, defibrillation or ACLS   In the event of cardiac or respiratory ARREST Use medication by any route, position, wound care, and other measures to relive pain and suffering. May use oxygen, suction and manual treatment of airway obstruction as needed for comfort.      07/04/17 0420    Code Status History    Date Active Date Inactive Code Status Order ID Comments User Context   06/11/2017  9:06 PM 06/19/2017  6:27 PM Full Code [id number] QuillDO ED       Prognosis:   < 6 months  Discharge Planning:  Home with Hospice  [facility]  Thank you for allowing the Palliative Medicine Team to assist in the care of this patient.   Time In: 0945 Time Out: 1030 Total Time 45 Prolonged Time Billed No      Greater than 50%  of this  time was spent counseling and coordinating care related to the above assessment and plan.  Micheline Rough, MD  Please contact Palliative Medicine Team phone at [phone] for questions and concerns.

## 2017-07-05 NOTE — Progress Notes (Signed)
Pleurx kit ordered for patient, and per case manager, Hospice will provide pleurx supplies after discharge.  Paperwork on chart does not need to be completed.  Hospice nurse  Quenton Fetter [phone] to be called to hook patient up to dilaudid drip, prior to discharge.

## 2017-07-05 NOTE — Progress Notes (Signed)
Oncology brief note   Pt previously seen by me for her recently diagnosed metastatic pancreatic cancer, pt enrolled to hospice last week,  Admitted for severe abdominal pain.   I spoke with IR Dr. Laurence Ferrari earlier today, and recommend peritoneal pleurx placement. I discussed with pt, she agrees.  I appreciate palliative care team taking excellent care of her.  Please let me know if there is anything else I can assist with . Pt will continue hospice care after discharge.  Truitt Merle  07/04/2017 8pm

## 2017-07-05 NOTE — Consult Note (Signed)
[facility]--Pt is currently enrolled with [facility] originally admitted to services in her home on 06/28/17 for metastatic pancreatic cancer.  She has had ongoing issues with pain management.  Due to difficulty getting pain under control in the home [facility] attempted to get pt to admit to [place] [facility] for a temporary stay to address pain management.  Pt refused this offer so Fentanyl PCA was started in the home prior to admitting to [facility].  Despite PCA Fentanyl and attempts to manage pain in the home, pt presented to [place] ED on 10/11 with severe abd pain, N/V.  Opioid was rotated from Fentanyl to Dilaudid during this hospitalization.  Visit to bedside today.  Pt is currently out of her room receiving placement of a Pleur-x drain.  Discussed case with Dr Domingo Cocking who has written a script for PCA Dilaudid.  Will work with [facility] to order PCA Dilaudid and pump for pt to d/c home with.  Pt is to have new PICC placed and spoke with RN Shanon Brow who states this may not be placed until tomorrow.  Will continue to follow hospital course and will plan to resume Hospice support when pt transitions back home.  Thank you for partnering with [facility] to care for this patient.  Wynetta Fines, RN

## 2017-07-06 MED ORDER — HYDROMORPHONE 1 MG/ML IV SOLN
INTRAVENOUS | Status: DC
  Administered 2017-07-06: 3.5 mg via INTRAVENOUS
  Administered 2017-07-06: 2 mg via INTRAVENOUS
  Administered 2017-07-06: 18:00:00 via INTRAVENOUS
  Administered 2017-07-06: 5 mg via INTRAVENOUS
  Administered 2017-07-07: 4.84 mg via INTRAVENOUS
  Administered 2017-07-07: 3 mg via INTRAVENOUS
  Administered 2017-07-07: 25 mg via INTRAVENOUS
  Administered 2017-07-07: 2.94 mg via INTRAVENOUS
  Administered 2017-07-07: 2.5 mg via INTRAVENOUS
  Administered 2017-07-07: 5.03 mg via INTRAVENOUS
  Administered 2017-07-07: 3 mg via INTRAVENOUS
  Administered 2017-07-07: 4.75 mg via INTRAVENOUS
  Administered 2017-07-08: 5.47 mg via INTRAVENOUS
  Administered 2017-07-08: 4.77 mg via INTRAVENOUS
  Filled 2017-07-06 (×3): qty 25

## 2017-07-06 MED ORDER — SODIUM CHLORIDE 0.9% FLUSH: 9.0000 mL | INTRAVENOUS | Status: DC | PRN

## 2017-07-06 MED ORDER — HALOPERIDOL 0.5 MG PO TABS
0.5000 mg | ORAL_TABLET | ORAL | Status: DC | PRN
  Filled 2017-07-06: qty 1

## 2017-07-06 MED ORDER — METOCLOPRAMIDE HCL 10 MG PO TABS
10.0000 mg | ORAL_TABLET | Freq: Three times a day (TID) | ORAL | Status: DC
  Administered 2017-07-06 – 2017-07-08 (×9): 10 mg via ORAL
  Filled 2017-07-06 (×9): qty 1

## 2017-07-06 NOTE — Progress Notes (Signed)
Daily Progress Note   Patient Name: Sierra Beck       Date: 07/06/2017 DOB: [birth year]  Age: 61 y.o. MRN#: [id number] Attending Physician: Velvet Bathe, MD Primary Care Physician: Berkley Harvey, NP Admit Date: 07/04/2017  Reason for Consultation/Follow-up: Non pain symptom management and Pain control  Subjective: I met today with Sierra Beck and she reports feeling worse this morning.  Large emesis at 0500.  She also thinks pain is much better, but still not at her goal.  Length of Stay: 2  Current Medications: Scheduled Meds:  . dexamethasone  1 mg Oral Q12H  . enoxaparin (LOVENOX) injection  40 mg Subcutaneous Q24H  . feeding supplement (ENSURE ENLIVE)  237 mL Oral BID BM  . HYDROmorphone   Intravenous Q4H  . levothyroxine  88 mcg Oral QAC breakfast  . LORazepam  0.5 mg Oral BID  . metoCLOPramide  10 mg Oral BID  . nicotine  21 mg Transdermal Daily  . pantoprazole  20 mg Oral Daily  . polyethylene glycol  17 g Oral BID  . pramipexole  0.125 mg Oral QHS  . rosuvastatin  10 mg Oral QPM  . senna  2 tablet Oral BID    Continuous Infusions: . albumin human      PRN Meds: acetaminophen **OR** acetaminophen, albumin human, diphenhydrAMINE **OR** diphenhydrAMINE, haloperidol, HYDROmorphone (DILAUDID) injection, LORazepam, naloxone **AND** sodium chloride flush, ondansetron (ZOFRAN) IV, ondansetron **OR** [DISCONTINUED] ondansetron (ZOFRAN) IV, promethazine, simethicone, sodium chloride flush, sodium chloride flush, sodium phosphate, sorbitol  Physical Exam         Well developed female, Alert, orientated, coherent, in moderate distress due to nausea and pain CV rrr Resp no distress, no w/c/r Abdomen distended but not firm, not tender to light palpation, palpable mass in central  abdomen   Vital Signs: BP 116/72 (BP Location: Right Arm)   Pulse 90   Temp 98.2 F (36.8 C) (Oral)   Resp 16   Ht '5\' 2"'$  (1.575 m)   Wt 68 kg (149 lb 14.6 oz)   SpO2 99%   BMI 27.42 kg/m  SpO2: SpO2: 99 % O2 Device: O2 Device: Not Delivered O2 Flow Rate: O2 Flow Rate (L/min): 2 L/min  Intake/output summary:   Intake/Output Summary (Last 24 hours) at 07/06/17 1045 Last data filed at 07/06/17 1000  Gross per 24 hour  Intake              290 ml  Output                3 ml  Net              287 ml   LBM: Last BM Date: 07/04/17 Baseline Weight: Weight: 81.6 kg (180 lb) Most recent weight: Weight: 68 kg (149 lb 14.6 oz)       Palliative Assessment/Data:      Patient Active Problem List   Diagnosis Date Noted  . Protein-calorie malnutrition, severe 07/04/2017  . Ascites, malignant   . Palliative care encounter   . Primary pancreatic cancer with metastasis to other site ([place]) 06/19/2017  . Encounter for palliative care   . Pancreatic mass   . Constipation   . Palliative care by specialist   . Abdominal pain, generalized   . Peritoneal carcinomatosis ([place]) 06/11/2017  . Nausea & vomiting 06/11/2017  . Intractable abdominal pain 06/11/2017  . HYPERLIPIDEMIA 10/21/2007  . Hypothyroidism 02/24/2007  . HYPERLIPIDEMIA 02/24/2007  . Essential hypertension 02/24/2007    Palliative Care Assessment & Plan   Patient Profile: 61 y.o. female  with past medical history of recently diagnosed metastatic cancer, diabetes, and thyroid disease who was admitted on 07/04/2017 with nausea, vomiting, and abdominal pain.  Sierra Beck is a patient with [facility].  Recommendations/Plan:  Pain: Addition of basal rate 0.'5mg'$ /hr.  Continue bolus 0.'5mg'$  with 15 minute lockout.  Plan on discharge is for home PCA, but working to get symptoms under better management prior to discharge.  Nausea: Suspect at least partially due to constipation.  She is requesting soap suds enema.   Order placed.  Also plan trial of reglan prior to meals as she had large emesis after eating.  Goals of Care and Additional Recommendations:  Limitations on Scope of Treatment: Full Comfort Care  Code Status:    Code Status Orders        Start     Ordered   07/04/17 0418  Do not attempt resuscitation (DNR)  Continuous    Question Answer Comment  In the event of cardiac or respiratory ARREST Do not call a "code blue"   In the event of cardiac or respiratory ARREST Do not perform Intubation, CPR, defibrillation or ACLS   In the event of cardiac or respiratory ARREST Use medication by any route, position, wound care, and other measures to relive pain and suffering. May use oxygen, suction and manual treatment of airway obstruction as needed for comfort.      07/04/17 0420    Code Status History    Date Active Date Inactive Code Status Order ID Comments User Context   06/11/2017  9:06 PM 06/19/2017  6:27 PM Full Code [id number]  Etta Quill, DO ED       Prognosis:   < 6 months  Discharge Planning:  Home with Hospice  Care plan was discussed with patient, RN, [facility]  Thank you for allowing the Palliative Medicine Team to assist in the care of this patient.   Time In: 1015 Time Out: 1100 Total Time 45 Prolonged Time Billed No      Greater than 50%  of this time was spent counseling and coordinating care related to the above assessment and plan.  Micheline Rough, MD  Please contact Palliative Medicine Team phone at [phone] for questions and concerns.

## 2017-07-06 NOTE — Progress Notes (Signed)
Soap suds enema given at this time.

## 2017-07-06 NOTE — Progress Notes (Signed)
PROGRESS NOTE    Sierra Beck  KDX:[id number] DOB: 09-10-1956 DOA: 07/04/2017 PCP: Berkley Harvey, NP    Brief Narrative:   61 y.o. female with medical history significant for hypertension, hypothyroidism, and recent diagnosis of metastatic cancer with probable pancreatic primary, now presenting to the emergency department for evaluation of severe abdominal pain, nausea, and vomiting. Patient was admitted to the hospital with severe abdominal pain, nausea, and vomiting about 3 weeks ago, was found to have peritoneal carcinomatosis, masses in the pancreas and liver, and underwent a peritoneal biopsy that suggest a creatinine primary. She was evaluated by oncology, but has now elected for hospice care and canceled her port placement.   Assessment & Plan:   Principal Problem:   Intractable abdominal pain - palliative consulted and patient has dilaudid pca pump and picc line inserted today. - Currently palliative team adjusting medication regimen  Active Problems:   Hypothyroidism   Essential hypertension   Peritoneal carcinomatosis (HCC)   Nausea & vomiting   Abdominal pain, generalized   Primary pancreatic cancer with metastasis to other site [facility])    Ascites, malignant - pleurx catheter placed - palliative assisted with plan   Palliative care encounter   Protein-calorie malnutrition, severe    DVT prophylaxis: Lovenox Code Status: DNR Family Communication: d/c patient and family at bedside Disposition Plan: d/c next am   Consultants:   Hospice   Procedures: None   Antimicrobials: none   Subjective: Pt requesting to go outside for a little while. No other complaints otherwise.  Objective: Vitals:   07/06/17 0400 07/06/17 0457 07/06/17 0640 07/06/17 0856  BP:  116/72    Pulse:  90    Resp: 16 17 16 16   Temp:  98.2 F (36.8 C)    TempSrc:  Oral    SpO2: 95% 99% 99% 99%  Weight:      Height:        Intake/Output Summary (Last 24 hours) at 07/06/17  1519 Last data filed at 07/06/17 1000  Gross per 24 hour  Intake               50 ml  Output                3 ml  Net               47 ml   Filed Weights   07/04/17 0116 07/04/17 0532  Weight: 81.6 kg (180 lb) 68 kg (149 lb 14.6 oz)    Examination: Exam unchanged when compared to 07/05/17  General exam: Appears calm and comfortable  Respiratory system: equal chest rise. Respiratory effort normal. Cardiovascular system: no cyanosis Gastrointestinal system: Abdomen is nondistended, soft and no guarding Central nervous system: Alert and oriented. No focal neurological deficits. Extremities: Symmetric 5 x 5 power. Skin: No rashes, lesions or ulcers, picc line in place Psychiatry: Judgement and insight appear normal. Mood & affect appropriate.     Data Reviewed: I have personally reviewed following labs and imaging studies  CBC:  Recent Labs Lab 07/04/17 0237  WBC 10.2  NEUTROABS 7.5  HGB 12.1  HCT 36.3  MCV 85.6  PLT 976   Basic Metabolic Panel:  Recent Labs Lab 07/04/17 0237  NA 131*  K 3.5  CL 91*  CO2 25  GLUCOSE 115*  BUN 29*  CREATININE 0.88  CALCIUM 8.6*   GFR: Estimated Creatinine Clearance: 60 mL/min (by C-G formula based on SCr of 0.88 mg/dL). Liver Function Tests:  Recent Labs Lab 07/04/17 0237  AST 20  ALT 15  ALKPHOS 142*  BILITOT 1.4*  PROT 6.4*  ALBUMIN 2.8*   No results for input(s): LIPASE, AMYLASE in the last 168 hours. No results for input(s): AMMONIA in the last 168 hours. Coagulation Profile:  Recent Labs Lab 07/04/17 1106  INR 1.13   Cardiac Enzymes: No results for input(s): CKTOTAL, CKMB, CKMBINDEX, TROPONINI in the last 168 hours. BNP (last 3 results) No results for input(s): PROBNP in the last 8760 hours. HbA1C: No results for input(s): HGBA1C in the last 72 hours. CBG: No results for input(s): GLUCAP in the last 168 hours. Lipid Profile: No results for input(s): CHOL, HDL, LDLCALC, TRIG, CHOLHDL, LDLDIRECT in  the last 72 hours. Thyroid Function Tests: No results for input(s): TSH, T4TOTAL, FREET4, T3FREE, THYROIDAB in the last 72 hours. Anemia Panel: No results for input(s): VITAMINB12, FOLATE, FERRITIN, TIBC, IRON, RETICCTPCT in the last 72 hours. Sepsis Labs: No results for input(s): PROCALCITON, LATICACIDVEN in the last 168 hours.  No results found for this or any previous visit (from the past 240 hour(s)).       Radiology Studies: Ir Fluoro Guide Cv Line Right  Result Date: 07/05/2017 INDICATION: 61 year old female with malignant ascites secondary to pancreatic adenocarcinoma. She presents for placement of a tunneled peritoneal drainage catheter as well as placement of a PICC. EXAM: IR PERC PLEURAL DRAIN W/INDWELL CATH W/IMG GUIDE; IR RIGHT FLOURO GUIDE CV LINE; IR ULTRASOUND GUIDANCE VASC ACCESS RIGHT MEDICATIONS: 2 g Ancef were administered intravenously within 1 hour of skin incision. ANESTHESIA/SEDATION: Fentanyl 100 mcg IV; Versed 2 mg IV Moderate Sedation Time:  37 minutes The patient was continuously monitored during the procedure by the interventional radiology nurse under my direct supervision. FLUOROSCOPY TIME:  5 minutes 42 seconds for a total of 77.8 mGy COMPLICATIONS: None immediate. PROCEDURE: Informed written consent was obtained from the patient after a thorough discussion of the procedural risks, benefits and alternatives. All questions were addressed. Maximal Sterile Barrier Technique was utilized including caps, mask, sterile gowns, sterile gloves, sterile drape, hand hygiene and skin antiseptic. A timeout was performed prior to the initiation of the procedure. PICC PLACEMENT Attention was first turned to the right arm. The medial aspect of the right upper arm was sterilely prepped and draped in standard fashion using chlorhexidine skin prep. A tourniquet was applied. The arm was interrogated with ultrasound. The brachial vein is widely patent. An image was obtained and stored for  the medical record. Local anesthesia was attained by infiltration with 1% lidocaine. Under real-time sonographic guidance, the right brachial vein was punctured using a 21 gauge micropuncture needle. A wire was advanced into the central venous vasculature in the needle was exchanged for a peel-away sheath. Unfortunately, the wire could not be navigated into the heart. The wire continued to buckle superiorly into the internal jugular vein. Fear Ing a central venous occlusion, and limited right upper extremity venogram was performed. The subclavian vein and superior vena cava are patent. The anatomy is just highly tortuous. Therefore, a 5 French angled catheter was advanced through the peel-away sheath an used to navigate the wire into the right heart. A 5 French dual-lumen power injectable PICC was then cut to 33 cm in advanced over the wire. The tip of the catheter is positioned at the superior cavoatrial junction. The catheter flushes and aspirates was ease. It was flushed with heparinized saline and secured to the skin with an adhesive fixation device. PLEUR-X PLACEMENT Attention  was next turned to the abdomen. The right abdomen was interrogated with ultrasound. There is moderate volume ascites. A suitable skin entry site was selected and marked. Following sterile prep and draped using chlorhexidine skin prep, local anesthesia was attained by infiltration with 1% lidocaine. A small dermatotomy was made. An 18 gauge sheath needle was then advanced through the dermatotomy and into the peritoneal space with return of yellow ascitic fluid. A 0.035 inch wire was then advanced through the sheath needle and into the peritoneal cavity. A small dermatotomy was made approximately 5 cm medial and slightly superior to the peritoneal entry site. This will serve as the catheter exit site. The PleurX catheter was then tunneled retrograde from the skin exit site to the dermatotomy overlying the peritoneal access site. A peel-away  sheath was then advanced over the wire and into the peritoneal space. The PleurX catheter was advanced through the peel-away sheath and the peel-away sheath was discarded. The catheter was connected to suction and a paracentesis was performed removing 1.6 L of fluid. The catheter was secured to the skin with 2-0 Prolene suture. The dermatotomy overlying the peritoneal access site was closed with an inverted interrupted 3-0 Vicryl suture in the epidermis sealed with derma bond. The patient tolerated the procedure well. IMPRESSION: 1. Placement of a right arm dual-lumen power PICC. The catheter tip is located at the superior cavoatrial junction. 2. Placement of a right abdominal tunneled peritoneal drainage catheter. Electronically Signed   By: Jacqulynn Cadet M.D.   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 She presents for placement of a tunneled peritoneal drainage catheter as well as placement of a PICC. EXAM: IR PERC PLEURAL DRAIN W/INDWELL CATH W/IMG GUIDE; IR RIGHT FLOURO GUIDE CV LINE; IR ULTRASOUND GUIDANCE VASC ACCESS RIGHT MEDICATIONS: 2 g Ancef were administered intravenously within 1 hour of skin incision. ANESTHESIA/SEDATION: Fentanyl 100 mcg IV; Versed 2 mg IV Moderate Sedation Time:  37 minutes The patient was continuously monitored during the procedure by the interventional radiology nurse under my direct supervision. FLUOROSCOPY TIME:  5 minutes 42 seconds for a total of 48.5 mGy COMPLICATIONS: None immediate. PROCEDURE: Informed written consent was obtained from the patient after a thorough discussion of the procedural risks, benefits and alternatives. All questions were addressed. Maximal Sterile Barrier Technique was utilized including caps, mask, sterile gowns, sterile gloves, sterile drape, hand hygiene and skin antiseptic. A timeout was performed prior to the  initiation of the procedure. PICC PLACEMENT Attention was first turned to the right arm. The medial aspect of the right upper arm was sterilely prepped and draped in standard fashion using chlorhexidine skin prep. A tourniquet was applied. The arm was interrogated with ultrasound. The brachial vein is widely patent. An image was obtained and stored for the medical record. Local anesthesia was attained by infiltration with 1% lidocaine. Under real-time sonographic guidance, the right brachial vein was punctured using a 21 gauge micropuncture needle. A wire was advanced into the central venous vasculature in the needle was exchanged for a peel-away sheath. Unfortunately, the wire could not be navigated into the heart. The wire continued to buckle superiorly into the internal jugular vein. Fear Ing a central venous occlusion, and limited right upper extremity venogram was performed. The subclavian vein and superior vena cava are patent. The anatomy is just highly tortuous. Therefore, a 5 French angled catheter was advanced through the peel-away  sheath an used to navigate the wire into the right heart. A 5 French dual-lumen power injectable PICC was then cut to 33 cm in advanced over the wire. The tip of the catheter is positioned at the superior cavoatrial junction. The catheter flushes and aspirates was ease. It was flushed with heparinized saline and secured to the skin with an adhesive fixation device. PLEUR-X PLACEMENT Attention was next turned to the abdomen. The right abdomen was interrogated with ultrasound. There is moderate volume ascites. A suitable skin entry site was selected and marked. Following sterile prep and draped using chlorhexidine skin prep, local anesthesia was attained by infiltration with 1% lidocaine. A small dermatotomy was made. An 18 gauge sheath needle was then advanced through the dermatotomy and into the peritoneal space with return of yellow ascitic fluid. A 0.035 inch wire was then  advanced through the sheath needle and into the peritoneal cavity. A small dermatotomy was made approximately 5 cm medial and slightly superior to the peritoneal entry site. This will serve as the catheter exit site. The PleurX catheter was then tunneled retrograde from the skin exit site to the dermatotomy overlying the peritoneal access site. A peel-away sheath was then advanced over the wire and into the peritoneal space. The PleurX catheter was advanced through the peel-away sheath and the peel-away sheath was discarded. The catheter was connected to suction and a paracentesis was performed removing 1.6 L of fluid. The catheter was secured to the skin with 2-0 Prolene suture. The dermatotomy overlying the peritoneal access site was closed with an inverted interrupted 3-0 Vicryl suture in the epidermis sealed with derma bond. The patient tolerated the procedure well. IMPRESSION: 1. Placement of a right arm dual-lumen power PICC. The catheter tip is located at the superior cavoatrial junction. 2. Placement of a right abdominal tunneled peritoneal drainage catheter. Electronically Signed   By: Jacqulynn Cadet M.D.   On: 07/05/2017 14:11   Ir Perc Pleural Drain W/indwell Cath W/img Guide  Result Date: 07/05/2017 INDICATION: 61 year old female with malignant ascites secondary to pancreatic adenocarcinoma. She presents for placement of a tunneled peritoneal drainage catheter as well as placement of a PICC. EXAM: IR PERC PLEURAL DRAIN W/INDWELL CATH W/IMG GUIDE; IR RIGHT FLOURO GUIDE CV LINE; IR ULTRASOUND GUIDANCE VASC ACCESS RIGHT MEDICATIONS: 2 g Ancef were administered intravenously within 1 hour of skin incision. ANESTHESIA/SEDATION: Fentanyl 100 mcg IV; Versed 2 mg IV Moderate Sedation Time:  37 minutes The patient was continuously monitored during the procedure by the interventional radiology nurse under my direct supervision. FLUOROSCOPY TIME:  5 minutes 42 seconds for a total of 40.9 mGy COMPLICATIONS:  None immediate. PROCEDURE: Informed written consent was obtained from the patient after a thorough discussion of the procedural risks, benefits and alternatives. All questions were addressed. Maximal Sterile Barrier Technique was utilized including caps, mask, sterile gowns, sterile gloves, sterile drape, hand hygiene and skin antiseptic. A timeout was performed prior to the initiation of the procedure. PICC PLACEMENT Attention was first turned to the right arm. The medial aspect of the right upper arm was sterilely prepped and draped in standard fashion using chlorhexidine skin prep. A tourniquet was applied. The arm was interrogated with ultrasound. The brachial vein is widely patent. An image was obtained and stored for the medical record. Local anesthesia was attained by infiltration with 1% lidocaine. Under real-time sonographic guidance, the right brachial vein was punctured using a 21 gauge micropuncture needle. A wire was advanced into the central venous vasculature in  the needle was exchanged for a peel-away sheath. Unfortunately, the wire could not be navigated into the heart. The wire continued to buckle superiorly into the internal jugular vein. Fear Ing a central venous occlusion, and limited right upper extremity venogram was performed. The subclavian vein and superior vena cava are patent. The anatomy is just highly tortuous. Therefore, a 5 French angled catheter was advanced through the peel-away sheath an used to navigate the wire into the right heart. A 5 French dual-lumen power injectable PICC was then cut to 33 cm in advanced over the wire. The tip of the catheter is positioned at the superior cavoatrial junction. The catheter flushes and aspirates was ease. It was flushed with heparinized saline and secured to the skin with an adhesive fixation device. PLEUR-X PLACEMENT Attention was next turned to the abdomen. The right abdomen was interrogated with ultrasound. There is moderate volume ascites.  A suitable skin entry site was selected and marked. Following sterile prep and draped using chlorhexidine skin prep, local anesthesia was attained by infiltration with 1% lidocaine. A small dermatotomy was made. An 18 gauge sheath needle was then advanced through the dermatotomy and into the peritoneal space with return of yellow ascitic fluid. A 0.035 inch wire was then advanced through the sheath needle and into the peritoneal cavity. A small dermatotomy was made approximately 5 cm medial and slightly superior to the peritoneal entry site. This will serve as the catheter exit site. The PleurX catheter was then tunneled retrograde from the skin exit site to the dermatotomy overlying the peritoneal access site. A peel-away sheath was then advanced over the wire and into the peritoneal space. The PleurX catheter was advanced through the peel-away sheath and the peel-away sheath was discarded. The catheter was connected to suction and a paracentesis was performed removing 1.6 L of fluid. The catheter was secured to the skin with 2-0 Prolene suture. The dermatotomy overlying the peritoneal access site was closed with an inverted interrupted 3-0 Vicryl suture in the epidermis sealed with derma bond. The patient tolerated the procedure well. IMPRESSION: 1. Placement of a right arm dual-lumen power PICC. The catheter tip is located at the superior cavoatrial junction. 2. Placement of a right abdominal tunneled peritoneal drainage catheter. Electronically Signed   By: Jacqulynn Cadet M.D.   On: 07/05/2017 14:11    Scheduled Meds: . dexamethasone  1 mg Oral Q12H  . enoxaparin (LOVENOX) injection  40 mg Subcutaneous Q24H  . feeding supplement (ENSURE ENLIVE)  237 mL Oral BID BM  . HYDROmorphone   Intravenous Q4H  . levothyroxine  88 mcg Oral QAC breakfast  . LORazepam  0.5 mg Oral BID  . metoCLOPramide  10 mg Oral TID AC & HS  . nicotine  21 mg Transdermal Daily  . pantoprazole  20 mg Oral Daily  .  polyethylene glycol  17 g Oral BID  . pramipexole  0.125 mg Oral QHS  . rosuvastatin  10 mg Oral QPM  . senna  2 tablet Oral BID   Continuous Infusions: . albumin human       LOS: 2 days    Time spent: > 15 minutes   Velvet Bathe, MD [facility] Pager [phone]  If 7PM-7AM, please contact night-coverage [email or web address] Password [email or web address] 07/06/2017, 3:19 PM

## 2017-07-06 NOTE — Progress Notes (Signed)
PA called to unit as patient had PleurX catheter placed 07/05/17 for comfort.  Per RN, patient doing fair after placement due to pain control issues.  No complaints related to drain.  Insertion site intact.  Patient planning for discharge in the AM and may drain at home as needed.   Brynda Greathouse, MS RD PA-C 4:09 PM

## 2017-07-07 MED ORDER — OLANZAPINE 5 MG PO TABS
5.0000 mg | ORAL_TABLET | Freq: Every day | ORAL | Status: DC
  Administered 2017-07-07: 5 mg via ORAL
  Filled 2017-07-07 (×2): qty 1

## 2017-07-07 NOTE — Consult Note (Signed)
[facility] with the patient at bedside. No family present. The patient reports that she is not currently experiencing pain, and she appears relaxed. She will not be going home today. Discussed with her that when she is discharged, she will be transported via ambulance. Reasured her that hospice will drain her pleurex as needed, and that she will return home with a PCA pump using her current dosage. [facility] will deliver CADD pump and Dilaudid to the hospital as soon as discharge date is confirmed, and will program up to date dosages of Dilaudid. The patient was able to complete her Advance Directives yesterday, and is very relieved.She asked that a fully electric bed be ordered for her use at home. Spoke with sister, Early Chars, via phone briefly and will follow up with her later, as she was driving when called. Early Chars asked if when the patient is ready for residential hospice, if she can be transferred to [facility], d/t proximetry to the patient's son. Bari Mantis that we will honor whatever the patient's wishes are for future care. Will continue to follow and resume services in the home at discharge.  Please contact [facility] for any concerns or questions.  Yetta Glassman RN [facility] [phone]

## 2017-07-07 NOTE — Progress Notes (Signed)
PROGRESS NOTE    Sierra Beck  XTK:[id number] DOB: January 08, 1956 DOA: 07/04/2017 PCP: Berkley Harvey, NP    Brief Narrative:   61 y.o. female with medical history significant for hypertension, hypothyroidism, and recent diagnosis of metastatic cancer with probable pancreatic primary, now presenting to the emergency department for evaluation of severe abdominal pain, nausea, and vomiting. Patient was admitted to the hospital with severe abdominal pain, nausea, and vomiting about 3 weeks ago, was found to have peritoneal carcinomatosis, masses in the pancreas and liver, and underwent a peritoneal biopsy that suggest a creatinine primary. She was evaluated by oncology, but has now elected for hospice care and canceled her port placement.   Assessment & Plan:   Principal Problem:   Intractable abdominal pain - palliative consulted and patient has dilaudid pca pump and picc line in place - Palliative team continues to monitor and adjust medication regimen.   Active Problems:   Hypothyroidism - stable on synthroid. Will continue    Essential hypertension   Peritoneal carcinomatosis (HCC)   Nausea & vomiting   Abdominal pain, generalized   Primary pancreatic cancer with metastasis to other site [facility])    Ascites, malignant - pleurx catheter placed - palliative assisted with plan   Palliative care encounter   Protein-calorie malnutrition, severe - continue ensure. Agree with dietitian evaluation   DVT prophylaxis: Lovenox Code Status: DNR Family Communication: d/c patient and family at bedside Disposition Plan: d/c next am   Consultants:   Hospice   Procedures: None   Antimicrobials: none   Subjective: Pt has no new complaints reported to me today.   Objective: Vitals:   07/06/17 2000 07/06/17 2359 07/07/17 0444 07/07/17 1358  BP:  (!) 109/57 123/75 136/78  Pulse:  84 86 80  Resp: 18 17 18 18   Temp:  98.6 F (37 C) 98.3 F (36.8 C) 97.9 F (36.6 C)  TempSrc:  Oral  Oral Oral  SpO2:  93% 94% 95%  Weight:      Height:        Intake/Output Summary (Last 24 hours) at 07/07/17 1544 Last data filed at 07/06/17 1826  Gross per 24 hour  Intake                0 ml  Output                3 ml  Net               -3 ml   Filed Weights   07/04/17 0116 07/04/17 0532  Weight: 81.6 kg (180 lb) 68 kg (149 lb 14.6 oz)    Examination: Exam unchanged when compared to 07/06/17  General exam: Appears calm and comfortable  Respiratory system: equal chest rise. Respiratory effort normal. Cardiovascular system: no cyanosis Gastrointestinal system: Abdomen is nondistended, soft and no guarding Central nervous system: Alert and oriented. No focal neurological deficits. Extremities: Symmetric 5 x 5 power. Skin: No rashes, lesions or ulcers, picc line in place Psychiatry: Judgement and insight appear normal. Mood & affect appropriate.     Data Reviewed: I have personally reviewed following labs and imaging studies  CBC:  Recent Labs Lab 07/04/17 0237  WBC 10.2  NEUTROABS 7.5  HGB 12.1  HCT 36.3  MCV 85.6  PLT 992   Basic Metabolic Panel:  Recent Labs Lab 07/04/17 0237  NA 131*  K 3.5  CL 91*  CO2 25  GLUCOSE 115*  BUN 29*  CREATININE 0.88  CALCIUM  8.6*   GFR: Estimated Creatinine Clearance: 60 mL/min (by C-G formula based on SCr of 0.88 mg/dL). Liver Function Tests:  Recent Labs Lab 07/04/17 0237  AST 20  ALT 15  ALKPHOS 142*  BILITOT 1.4*  PROT 6.4*  ALBUMIN 2.8*   No results for input(s): LIPASE, AMYLASE in the last 168 hours. No results for input(s): AMMONIA in the last 168 hours. Coagulation Profile:  Recent Labs Lab 07/04/17 1106  INR 1.13   Cardiac Enzymes: No results for input(s): CKTOTAL, CKMB, CKMBINDEX, TROPONINI in the last 168 hours. BNP (last 3 results) No results for input(s): PROBNP in the last 8760 hours. HbA1C: No results for input(s): HGBA1C in the last 72 hours. CBG: No results for input(s): GLUCAP  in the last 168 hours. Lipid Profile: No results for input(s): CHOL, HDL, LDLCALC, TRIG, CHOLHDL, LDLDIRECT in the last 72 hours. Thyroid Function Tests: No results for input(s): TSH, T4TOTAL, FREET4, T3FREE, THYROIDAB in the last 72 hours. Anemia Panel: No results for input(s): VITAMINB12, FOLATE, FERRITIN, TIBC, IRON, RETICCTPCT in the last 72 hours. Sepsis Labs: No results for input(s): PROCALCITON, LATICACIDVEN in the last 168 hours.  No results found for this or any previous visit (from the past 240 hour(s)).       Radiology Studies: No results found.  Scheduled Meds: . dexamethasone  1 mg Oral Q12H  . enoxaparin (LOVENOX) injection  40 mg Subcutaneous Q24H  . feeding supplement (ENSURE ENLIVE)  237 mL Oral BID BM  . HYDROmorphone   Intravenous Q4H  . levothyroxine  88 mcg Oral QAC breakfast  . LORazepam  0.5 mg Oral BID  . metoCLOPramide  10 mg Oral TID AC & HS  . nicotine  21 mg Transdermal Daily  . OLANZapine  5 mg Oral QHS  . pantoprazole  20 mg Oral Daily  . polyethylene glycol  17 g Oral BID  . pramipexole  0.125 mg Oral QHS  . rosuvastatin  10 mg Oral QPM  . senna  2 tablet Oral BID   Continuous Infusions: . albumin human       LOS: 3 days    Time spent: > 15 minutes   Velvet Bathe, MD [facility] Pager [phone]  If 7PM-7AM, please contact night-coverage [email or web address] Password [facility] 07/07/2017, 3:44 PM

## 2017-07-07 NOTE — Progress Notes (Signed)
Referring Physician(s):  Dr. Micheline Rough  Supervising Physician: Aletta Edouard  Patient Status:  [facility] - In-pt  Chief Complaint:  Recurrent pleural effusion s/p right pleurx placement  Subjective: Trying to prepare for going home.  No concerns related to pleurx.   Allergies: Codeine  Medications: Prior to Admission medications   Medication Sig Start Date End Date Taking? Authorizing Provider  bisoprolol-hydrochlorothiazide (ZIAC) 2.5-6.25 MG tablet Take 1 tablet by mouth daily.   Yes [provider]  dexamethasone (DECADRON) 1 MG tablet Take 1 tablet (1 mg total) by mouth every 12 (twelve) hours. Patient taking differently: Take 1 mg by mouth daily.  06/18/17  Yes Rosita Fire, MD  fentaNYL (SUBLIMAZE) 100 MCG/2ML injection Inject 100 mcg into the vein continuous.   Yes [provider]  haloperidol (HALDOL) 0.5 MG tablet Take 0.5 mg by mouth every 6 (six) hours as needed for agitation. Restless legs   Yes [provider]  levothyroxine (SYNTHROID, LEVOTHROID) 88 MCG tablet Take 88 mcg by mouth daily before breakfast. 03/15/17  Yes [provider]  LORazepam (ATIVAN) 0.5 MG tablet Place 1 tablet (0.5 mg total) under the tongue every 4 (four) hours as needed for anxiety (offer if patient seems anxious, offer at bedtime). 06/19/17  Yes Rosita Fire, MD  metoCLOPramide (REGLAN) 10 MG tablet Take 1 tablet (10 mg total) by mouth 4 (four) times daily -  before meals and at bedtime. Patient taking differently: Take 10 mg by mouth 2 (two) times daily.  06/20/17  Yes Alla Feeling, NP  naproxen sodium (ANAPROX) 220 MG tablet Take 220-440 mg by mouth every 4 (four) hours as needed (for pain).   Yes [provider]  ondansetron (ZOFRAN) 8 MG tablet Take 1 tablet (8 mg total) by mouth every 8 (eight) hours as needed for nausea or vomiting. Patient taking differently: Take 16 mg by mouth every 8 (eight) hours as needed for nausea or  vomiting.  06/20/17  Yes Alla Feeling, NP  pantoprazole (PROTONIX) 20 MG tablet Take 20 mg by mouth daily.   Yes [provider]  polyethylene glycol (MIRALAX / GLYCOLAX) packet Take 17 g by mouth 2 (two) times daily. 06/19/17  Yes Rosita Fire, MD  pramipexole (MIRAPEX) 0.125 MG tablet Take 0.125 mg by mouth at bedtime. 03/15/17  Yes [provider]  promethazine (PHENERGAN) 25 MG suppository Place 25 mg rectally every 6 (six) hours as needed for nausea or vomiting.   Yes [provider]  rosuvastatin (CRESTOR) 10 MG tablet Take 10 mg by mouth every evening.   Yes [provider]  senna (SENOKOT) 8.6 MG TABS tablet Take 2 tablets (17.2 mg total) by mouth 2 (two) times daily. 06/19/17  Yes Rosita Fire, MD  simethicone (MYLICON) 80 MG chewable tablet Chew 80 mg by mouth every 6 (six) hours as needed for flatulence.   Yes [provider]  bisacodyl (DULCOLAX) 10 MG suppository Place 1 suppository (10 mg total) rectally daily as needed for moderate constipation. Patient not taking: Reported on 07/04/2017 06/19/17   Rosita Fire, MD  diphenhydrAMINE (BENADRYL) 50 MG capsule Take 1 capsule (50 mg total) by mouth every 8 (eight) hours as needed. Patient not taking: Reported on 07/04/2017 06/18/17   Rosita Fire, MD  fentaNYL (DURAGESIC - DOSED MCG/HR) 25 MCG/HR patch Place 1 patch (25 mcg total) onto the skin every 3 (three) days. Patient not taking: Reported on 07/04/2017 06/20/17   Kalman Shan,  Wilhemina Cash, NP  HYDROmorphone (DILAUDID) 1 mg/mL injection Hydromorphone PCA  Basal rate: None Bolus Dose: 0.5mg  Lockout: 15 minutes One hour Limit: 2mg   Dispense: 500mg   Please adjust concentration and dispense volume as appropriate.  This is a hospice patient.  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 PCA to be further managed by hospice attending thru [place] on discharge.  07/05/17   Micheline Rough, MD  HYDROmorphone (DILAUDID) 2 MG tablet Take 1 tablet (2 mg total) by mouth every 4 (four) hours as needed (For breakthrough pain). Patient not taking: Reported on 07/04/2017 06/22/17   Volanda Napoleon, MD     Vital Signs: BP 123/75 (BP Location: Left Arm)   Pulse 86   Temp 98.3 F (36.8 C) (Oral)   Resp 18   Ht 5\' 2"  (1.575 m)   Wt 149 lb 14.6 oz (68 kg)   SpO2 94%   BMI 27.42 kg/m   Physical Exam  NAD, alert, resting in bed Chest:  Pleurx in place.  Site assessed; c/d/i.  Small amount of fluid present on dressing, but does not appear to be leaking.  Ready for use at home.   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 She presents for placement of a tunneled peritoneal drainage catheter as well as placement of a PICC. EXAM: IR PERC PLEURAL DRAIN W/INDWELL CATH W/IMG GUIDE; IR RIGHT FLOURO GUIDE CV LINE; IR ULTRASOUND GUIDANCE VASC ACCESS RIGHT MEDICATIONS: 2 g Ancef were administered intravenously within 1 hour of skin incision. ANESTHESIA/SEDATION: Fentanyl 100 mcg IV; Versed 2 mg IV Moderate Sedation Time:  37 minutes The patient was continuously monitored during the procedure by the interventional radiology nurse under my direct supervision. FLUOROSCOPY TIME:  5 minutes 42 seconds for a total of 10.6 mGy COMPLICATIONS: None immediate. PROCEDURE: Informed written consent was obtained from the patient after a thorough discussion of the procedural risks, benefits and alternatives. All questions were addressed. Maximal Sterile Barrier Technique was utilized including caps, mask, sterile gowns, sterile gloves, sterile drape, hand hygiene and skin antiseptic. A timeout was performed prior to the initiation of the procedure. PICC PLACEMENT Attention was first turned to the right arm. The medial aspect of the right upper arm was sterilely prepped and  draped in standard fashion using chlorhexidine skin prep. A tourniquet was applied. The arm was interrogated with ultrasound. The brachial vein is widely patent. An image was obtained and stored for the medical record. Local anesthesia was attained by infiltration with 1% lidocaine. Under real-time sonographic guidance, the right brachial vein was punctured using a 21 gauge micropuncture needle. A wire was advanced into the central venous vasculature in the needle was exchanged for a peel-away sheath. Unfortunately, the wire could not be navigated into the heart. The wire continued to buckle superiorly into the internal jugular vein. Fear Ing a central venous occlusion, and limited right upper extremity venogram was performed. The subclavian vein and superior vena cava are patent. The anatomy is just highly tortuous. Therefore, a 5 French angled catheter was advanced through the peel-away sheath an used to navigate the wire into the right heart. A 5 French dual-lumen power injectable PICC was then cut to 33 cm in advanced over the wire. The tip of the catheter is positioned at the superior cavoatrial junction. The catheter flushes and aspirates was ease. It was flushed with heparinized saline and secured  to the skin with an adhesive fixation device. PLEUR-X PLACEMENT Attention was next turned to the abdomen. The right abdomen was interrogated with ultrasound. There is moderate volume ascites. A suitable skin entry site was selected and marked. Following sterile prep and draped using chlorhexidine skin prep, local anesthesia was attained by infiltration with 1% lidocaine. A small dermatotomy was made. An 18 gauge sheath needle was then advanced through the dermatotomy and into the peritoneal space with return of yellow ascitic fluid. A 0.035 inch wire was then advanced through the sheath needle and into the peritoneal cavity. A small dermatotomy was made approximately 5 cm medial and slightly superior to the  peritoneal entry site. This will serve as the catheter exit site. The PleurX catheter was then tunneled retrograde from the skin exit site to the dermatotomy overlying the peritoneal access site. A peel-away sheath was then advanced over the wire and into the peritoneal space. The PleurX catheter was advanced through the peel-away sheath and the peel-away sheath was discarded. The catheter was connected to suction and a paracentesis was performed removing 1.6 L of fluid. The catheter was secured to the skin with 2-0 Prolene suture. The dermatotomy overlying the peritoneal access site was closed with an inverted interrupted 3-0 Vicryl suture in the epidermis sealed with derma bond. The patient tolerated the procedure well. IMPRESSION: 1. Placement of a right arm dual-lumen power PICC. The catheter tip is located at the superior cavoatrial junction. 2. Placement of a right abdominal tunneled peritoneal drainage catheter. Electronically Signed   By: Jacqulynn Cadet M.D.   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 She presents for placement of a tunneled peritoneal drainage catheter as well as placement of a PICC. EXAM: IR PERC PLEURAL DRAIN W/INDWELL CATH W/IMG GUIDE; IR RIGHT FLOURO GUIDE CV LINE; IR ULTRASOUND GUIDANCE VASC ACCESS RIGHT MEDICATIONS: 2 g Ancef were administered intravenously within 1 hour of skin incision. ANESTHESIA/SEDATION: Fentanyl 100 mcg IV; Versed 2 mg IV Moderate Sedation Time:  37 minutes The patient was continuously monitored during the procedure by the interventional radiology nurse under my direct supervision. FLUOROSCOPY TIME:  5 minutes 42 seconds for a total of 81.8 mGy COMPLICATIONS: None immediate. PROCEDURE: Informed written consent was obtained from the patient after a thorough discussion of the procedural risks, benefits and alternatives. All  questions were addressed. Maximal Sterile Barrier Technique was utilized including caps, mask, sterile gowns, sterile gloves, sterile drape, hand hygiene and skin antiseptic. A timeout was performed prior to the initiation of the procedure. PICC PLACEMENT Attention was first turned to the right arm. The medial aspect of the right upper arm was sterilely prepped and draped in standard fashion using chlorhexidine skin prep. A tourniquet was applied. The arm was interrogated with ultrasound. The brachial vein is widely patent. An image was obtained and stored for the medical record. Local anesthesia was attained by infiltration with 1% lidocaine. Under real-time sonographic guidance, the right brachial vein was punctured using a 21 gauge micropuncture needle. A wire was advanced into the central venous vasculature in the needle was exchanged for a peel-away sheath. Unfortunately, the wire could not be navigated into the heart. The wire continued to buckle superiorly into the internal jugular vein. Fear Ing a central venous occlusion, and limited right upper extremity venogram was performed. The subclavian vein and superior vena cava are patent. The anatomy is just highly tortuous.  Therefore, a 5 French angled catheter was advanced through the peel-away sheath an used to navigate the wire into the right heart. A 5 French dual-lumen power injectable PICC was then cut to 33 cm in advanced over the wire. The tip of the catheter is positioned at the superior cavoatrial junction. The catheter flushes and aspirates was ease. It was flushed with heparinized saline and secured to the skin with an adhesive fixation device. PLEUR-X PLACEMENT Attention was next turned to the abdomen. The right abdomen was interrogated with ultrasound. There is moderate volume ascites. A suitable skin entry site was selected and marked. Following sterile prep and draped using chlorhexidine skin prep, local anesthesia was attained by infiltration  with 1% lidocaine. A small dermatotomy was made. An 18 gauge sheath needle was then advanced through the dermatotomy and into the peritoneal space with return of yellow ascitic fluid. A 0.035 inch wire was then advanced through the sheath needle and into the peritoneal cavity. A small dermatotomy was made approximately 5 cm medial and slightly superior to the peritoneal entry site. This will serve as the catheter exit site. The PleurX catheter was then tunneled retrograde from the skin exit site to the dermatotomy overlying the peritoneal access site. A peel-away sheath was then advanced over the wire and into the peritoneal space. The PleurX catheter was advanced through the peel-away sheath and the peel-away sheath was discarded. The catheter was connected to suction and a paracentesis was performed removing 1.6 L of fluid. The catheter was secured to the skin with 2-0 Prolene suture. The dermatotomy overlying the peritoneal access site was closed with an inverted interrupted 3-0 Vicryl suture in the epidermis sealed with derma bond. The patient tolerated the procedure well. IMPRESSION: 1. Placement of a right arm dual-lumen power PICC. The catheter tip is located at the superior cavoatrial junction. 2. Placement of a right abdominal tunneled peritoneal drainage catheter. Electronically Signed   By: Jacqulynn Cadet M.D.   On: 07/05/2017 14:11   Dg Abd Acute W/chest  Result Date: 07/04/2017 CLINICAL DATA:  Vomiting and abdominal pain EXAM: DG ABDOMEN ACUTE W/ 1V CHEST COMPARISON:  06/14/2017 FINDINGS: Nodularity in the right base, indeterminate. Left lung is clear. Pulmonary vasculature is normal. Heart size is normal. Hilar and mediastinal contours are unremarkable. Supine and left lateral decubitus views of the abdomen are negative for bowel obstruction or perforation. No biliary or urinary calculi are evident. IMPRESSION: 1. Nonspecific mild nodularity in the right base, indeterminate. This could be  infectious or neoplastic. 2. No acute findings in the abdomen. Electronically Signed   By: Andreas Newport M.D.   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 She presents for placement of a tunneled peritoneal drainage catheter as well as placement of a PICC. EXAM: IR PERC PLEURAL DRAIN W/INDWELL CATH W/IMG GUIDE; IR RIGHT FLOURO GUIDE CV LINE; IR ULTRASOUND GUIDANCE VASC ACCESS RIGHT MEDICATIONS: 2 g Ancef were administered intravenously within 1 hour of skin incision. ANESTHESIA/SEDATION: Fentanyl 100 mcg IV; Versed 2 mg IV Moderate Sedation Time:  37 minutes The patient was continuously monitored during the procedure by the interventional radiology nurse under my direct supervision. FLUOROSCOPY TIME:  5 minutes 42 seconds for a total of 52.8 mGy COMPLICATIONS: None immediate. PROCEDURE: Informed written consent was obtained from the patient after a thorough discussion of the procedural risks, benefits and alternatives. All questions were  addressed. Maximal Sterile Barrier Technique was utilized including caps, mask, sterile gowns, sterile gloves, sterile drape, hand hygiene and skin antiseptic. A timeout was performed prior to the initiation of the procedure. PICC PLACEMENT Attention was first turned to the right arm. The medial aspect of the right upper arm was sterilely prepped and draped in standard fashion using chlorhexidine skin prep. A tourniquet was applied. The arm was interrogated with ultrasound. The brachial vein is widely patent. An image was obtained and stored for the medical record. Local anesthesia was attained by infiltration with 1% lidocaine. Under real-time sonographic guidance, the right brachial vein was punctured using a 21 gauge micropuncture needle. A wire was advanced into the central venous vasculature in the needle was exchanged for  a peel-away sheath. Unfortunately, the wire could not be navigated into the heart. The wire continued to buckle superiorly into the internal jugular vein. Fear Ing a central venous occlusion, and limited right upper extremity venogram was performed. The subclavian vein and superior vena cava are patent. The anatomy is just highly tortuous. Therefore, a 5 French angled catheter was advanced through the peel-away sheath an used to navigate the wire into the right heart. A 5 French dual-lumen power injectable PICC was then cut to 33 cm in advanced over the wire. The tip of the catheter is positioned at the superior cavoatrial junction. The catheter flushes and aspirates was ease. It was flushed with heparinized saline and secured to the skin with an adhesive fixation device. PLEUR-X PLACEMENT Attention was next turned to the abdomen. The right abdomen was interrogated with ultrasound. There is moderate volume ascites. A suitable skin entry site was selected and marked. Following sterile prep and draped using chlorhexidine skin prep, local anesthesia was attained by infiltration with 1% lidocaine. A small dermatotomy was made. An 18 gauge sheath needle was then advanced through the dermatotomy and into the peritoneal space with return of yellow ascitic fluid. A 0.035 inch wire was then advanced through the sheath needle and into the peritoneal cavity. A small dermatotomy was made approximately 5 cm medial and slightly superior to the peritoneal entry site. This will serve as the catheter exit site. The PleurX catheter was then tunneled retrograde from the skin exit site to the dermatotomy overlying the peritoneal access site. A peel-away sheath was then advanced over the wire and into the peritoneal space. The PleurX catheter was advanced through the peel-away sheath and the peel-away sheath was discarded. The catheter was connected to suction and a paracentesis was performed removing 1.6 L of fluid. The catheter was  secured to the skin with 2-0 Prolene suture. The dermatotomy overlying the peritoneal access site was closed with an inverted interrupted 3-0 Vicryl suture in the epidermis sealed with derma bond. The patient tolerated the procedure well. IMPRESSION: 1. Placement of a right arm dual-lumen power PICC. The catheter tip is located at the superior cavoatrial junction. 2. Placement of a right abdominal tunneled peritoneal drainage catheter. Electronically Signed   By: Jacqulynn Cadet M.D.   On: 07/05/2017 14:11    Labs:  CBC:  Recent Labs  06/12/17 0439 06/16/17 0439 06/17/17 0245 07/04/17 0237  WBC 8.2 8.3 9.5 10.2  HGB 10.8* 10.7* 10.8* 12.1  HCT 33.5* 34.0* 34.6* 36.3  PLT 295 302 285 294    COAGS:  Recent Labs  06/16/17 0439 07/04/17 1106  INR 1.23 1.13  APTT 30  --     BMP:  Recent Labs  06/11/17 1426 06/12/17 0439 06/17/17  0245 07/04/17 0237  NA 136 136 136 131*  K 3.3* 4.5 3.8 3.5  CL 99* 104 102 91*  CO2 26 27 28 25   GLUCOSE 127* 97 160* 115*  BUN 12 11 7  29*  CALCIUM 8.9 8.5* 8.3* 8.6*  CREATININE 0.71 0.76 0.52 0.88  GFRNONAA >60 >60 >60 >60  GFRAA >60 >60 >60 >60    LIVER FUNCTION TESTS:  Recent Labs  06/11/17 1426 07/04/17 0237  BILITOT 0.4 1.4*  AST 23 20  ALT 13* 15  ALKPHOS 133* 142*  PROT 6.7 6.4*  ALBUMIN 3.2* 2.8*    Assessment and Plan: Right Pleural Effusion s/p Pleurx placement 07/05/17. Patient without concerns or complaints related to Pleurx.  Home health has provided supplies for her to take home when ready.  It can be used as needed.  IR available.  Electronically Signed: Docia Barrier, PA 07/07/2017, 11:07 AM   I spent a total of 15 Minutes at the the patient's bedside AND on the patient's hospital floor or unit, greater than 50% of which was counseling/coordinating care for right pleural effusion.

## 2017-07-07 NOTE — Progress Notes (Signed)
Daily Progress Note   Patient Name: Sierra Beck       Date: 07/07/2017 DOB: 09-02-1956  Age: 61 y.o. MRN#: [id number] Attending Physician: Velvet Bathe, MD Primary Care Physician: Berkley Harvey, NP Admit Date: 07/04/2017  Reason for Consultation/Follow-up: Non pain symptom management and Pain control  Subjective: I met today with Sierra Beck and she reports feeling "OK, I guess".  Pain and nausea are better, but still not as well managed as she hopes.    Reports that she may be getting more mentally down from remaining in the hospital.  She wants to get home as soon as possible, but also worries about coming back if she leaves too early.  I discussed that primary attending will ultimately make determination if she is ready for discharge, but I would support either action plan.  Length of Stay: 3  Current Medications: Scheduled Meds:  . dexamethasone  1 mg Oral Q12H  . enoxaparin (LOVENOX) injection  40 mg Subcutaneous Q24H  . feeding supplement (ENSURE ENLIVE)  237 mL Oral BID BM  . HYDROmorphone   Intravenous Q4H  . levothyroxine  88 mcg Oral QAC breakfast  . LORazepam  0.5 mg Oral BID  . metoCLOPramide  10 mg Oral TID AC & HS  . nicotine  21 mg Transdermal Daily  . OLANZapine  5 mg Oral QHS  . pantoprazole  20 mg Oral Daily  . polyethylene glycol  17 g Oral BID  . pramipexole  0.125 mg Oral QHS  . rosuvastatin  10 mg Oral QPM  . senna  2 tablet Oral BID    Continuous Infusions: . albumin human      PRN Meds: acetaminophen **OR** acetaminophen, albumin human, haloperidol, HYDROmorphone (DILAUDID) injection, LORazepam, ondansetron **OR** [DISCONTINUED] ondansetron (ZOFRAN) IV, promethazine, simethicone, sodium chloride flush, sodium chloride flush, sodium chloride flush, sodium  phosphate, sorbitol  Physical Exam         Well developed female, Alert, orientated, coherent, appears tired but in no distress due to nausea and pain CV rrr Resp no distress, no w/c/r Abdomen distended but not firm, not tender to light palpation, palpable mass in central abdomen   Vital Signs: BP 123/75 (BP Location: Left Arm)   Pulse 86   Temp 98.3 F (36.8 C) (Oral)   Resp 18  Ht '5\' 2"'$  (1.575 m)   Wt 68 kg (149 lb 14.6 oz)   SpO2 94%   BMI 27.42 kg/m  SpO2: SpO2: 94 % O2 Device: O2 Device: Not Delivered O2 Flow Rate: O2 Flow Rate (L/min): 2 L/min  Intake/output summary:   Intake/Output Summary (Last 24 hours) at 07/07/17 1015 Last data filed at 07/06/17 1826  Gross per 24 hour  Intake              120 ml  Output                3 ml  Net              117 ml   LBM: Last BM Date: (P) 07/04/17 Baseline Weight: Weight: 81.6 kg (180 lb) Most recent weight: Weight: 68 kg (149 lb 14.6 oz)       Palliative Assessment/Data:      Patient Active Problem List   Diagnosis Date Noted  . Protein-calorie malnutrition, severe 07/04/2017  . Ascites, malignant   . Palliative care encounter   . Primary pancreatic cancer with metastasis to other site ([place]) 06/19/2017  . Encounter for palliative care   . Pancreatic mass   . Constipation   . Palliative care by specialist   . Abdominal pain, generalized   . Peritoneal carcinomatosis ([place]) 06/11/2017  . Nausea & vomiting 06/11/2017  . Intractable abdominal pain 06/11/2017  . HYPERLIPIDEMIA 10/21/2007  . Hypothyroidism 02/24/2007  . HYPERLIPIDEMIA 02/24/2007  . Essential hypertension 02/24/2007    Palliative Care Assessment & Plan   Patient Profile: 61 y.o. female  with past medical history of recently diagnosed metastatic cancer, diabetes, and thyroid disease who was admitted on 07/04/2017 with nausea, vomiting, and abdominal pain.  Sierra Beck is a patient with [facility].  Recommendations/Plan:  Pain:  Has  used '20mg'$  over the last 24 hours.  It does not appear to me that basal rate 0.'5mg'$ /hr is running at this time. I asked her RN to review pump settings.  Will also continue bolus 0.'5mg'$  with 15 minute lockout.  Plan on discharge is for home PCA, but working to get symptoms under good management prior to discharge.  Nausea: Continue reglan prior to meals.  Plan addition of zyprexa nightly.  Discussed with [facility]OP liaison and she will review plan for drainage at home with patient when she sees her.  Constipation: Reports no real success with enema.  Asked RN to give sorbitol today.   Goals of Care and Additional Recommendations:  Limitations on Scope of Treatment: Full Comfort Care  Code Status:    Code Status Orders        Start     Ordered   07/04/17 0418  Do not attempt resuscitation (DNR)  Continuous    Question Answer Comment  In the event of cardiac or respiratory ARREST Do not call a "code blue"   In the event of cardiac or respiratory ARREST Do not perform Intubation, CPR, defibrillation or ACLS   In the event of cardiac or respiratory ARREST Use medication by any route, position, wound care, and other measures to relive pain and suffering. May use oxygen, suction and manual treatment of airway obstruction as needed for comfort.      07/04/17 0420    Code Status History    Date Active Date Inactive Code Status Order ID Comments User Context   06/11/2017  9:06 PM 06/19/2017  6:27 PM Full Code [id number]  Etta Quill,  DO ED       Prognosis:   < 6 months  Discharge Planning:  Home with Hospice  Care plan was discussed with patient, RN, [facility] liaison  Thank you for allowing the Palliative Medicine Team to assist in the care of this patient.   Time In: 0930 Time Out: 30 Total Time 30 Prolonged Time Billed No      Greater than 50%  of this time was spent counseling and coordinating care related to the above assessment and plan.  Micheline Rough,  MD  Please contact Palliative Medicine Team phone at [phone] for questions and concerns.

## 2017-07-08 MED ORDER — HEPARIN SOD (PORK) LOCK FLUSH 100 UNIT/ML IV SOLN
250.0000 [IU] | INTRAVENOUS | Status: AC | PRN
  Administered 2017-07-08: 19:00:00

## 2017-07-08 MED ORDER — ENSURE ENLIVE PO LIQD: 237.0000 mL | Freq: Two times a day (BID) | ORAL | 12 refills | Status: AC

## 2017-07-08 MED ORDER — OLANZAPINE 5 MG PO TABS
5.0000 mg | ORAL_TABLET | Freq: Every day | ORAL | 0 refills | Status: AC
Start: 2017-07-08 — End: ?

## 2017-07-08 NOTE — Care Management Note (Signed)
Case Management Note  Patient Details  Name: DENECIA BRUNETTE MRN: [id number] Date of Birth: 26-Oct-1955  Subjective/Objective:                  abd pain controll issues  Action/Plan: Date:  July 08, 2017 Chart reviewed for concurrent status and case management needs.  Will continue to follow patient progress.  Discharge Planning: following for needs  Expected discharge date: July 11, 2017  Velva Harman, BSN, [place], [place]   Expected Discharge Date:  07/05/17               Expected Discharge Plan:  Home/Self Care  In-House Referral:     Discharge planning Services  CM Consult  Post Acute Care Choice:    Choice offered to:     DME Arranged:    DME Agency:     HH Arranged:    HH Agency:     Status of Service:  In process, will continue to follow  If discussed at Long Length of Stay Meetings, dates discussed:    Additional Comments:  Leeroy Cha, RN 07/08/2017, 10:13 AM

## 2017-07-08 NOTE — Discharge Summary (Signed)
Pt picked up by [facility] for transportation home. VSS. Pt belongings sent with [facility]. Per day shift RN Vikki Ports, pt given all discharge paperwork and teaching. Pt confirms that she understands her D/C instructions.  Report given to [facility] and pt discharged.

## 2017-07-08 NOTE — Progress Notes (Signed)
July 08, 2017 Chart and discharge orders researched for Case Management needs. None found and patient discharged to appropriate level of care.  hOSPICE PATIENT HOME VIA [facility] PER PATIENT CHOICE. Patient and family have no further questions. Velva Harman, BSN, [place], [place].

## 2017-07-08 NOTE — Progress Notes (Signed)
Daily Progress Note   Patient Name: Sierra Beck       Date: 07/08/2017 DOB: 14-Mar-1956  Age: 61 y.o. MRN#: [id number] Attending Physician: Velvet Bathe, MD Primary Care Physician: Berkley Harvey, NP Admit Date: 07/04/2017  Reason for Consultation/Follow-up: Non pain symptom management and Pain control  Subjective: I met today with Sierra Beck and she reports feeling "Better".    She reports being ready to get home and we discussed plan for symptom management thru hospice on discharge.  Length of Stay: 4  Current Medications: Scheduled Meds:  . dexamethasone  1 mg Oral Q12H  . enoxaparin (LOVENOX) injection  40 mg Subcutaneous Q24H  . feeding supplement (ENSURE ENLIVE)  237 mL Oral BID BM  . HYDROmorphone   Intravenous Q4H  . levothyroxine  88 mcg Oral QAC breakfast  . LORazepam  0.5 mg Oral BID  . metoCLOPramide  10 mg Oral TID AC & HS  . nicotine  21 mg Transdermal Daily  . OLANZapine  5 mg Oral QHS  . pantoprazole  20 mg Oral Daily  . polyethylene glycol  17 g Oral BID  . pramipexole  0.125 mg Oral QHS  . rosuvastatin  10 mg Oral QPM  . senna  2 tablet Oral BID    Continuous Infusions: . albumin human      PRN Meds: acetaminophen **OR** acetaminophen, albumin human, haloperidol, HYDROmorphone (DILAUDID) injection, LORazepam, ondansetron **OR** [DISCONTINUED] ondansetron (ZOFRAN) IV, promethazine, simethicone, sodium chloride flush, sodium chloride flush, sodium chloride flush, sodium phosphate, sorbitol  Physical Exam         Well developed female, Alert, orientated, coherent, appears tired but in no distress due to nausea and pain CV rrr Resp no distress, no w/c/r Abdomen distended but not firm, not tender to light palpation, palpable mass in central abdomen   Vital  Signs: BP 135/71 (BP Location: Left Arm)   Pulse 92   Temp 98.2 F (36.8 C) (Oral)   Resp 18   Ht '5\' 2"'$  (1.575 m)   Wt 68 kg (149 lb 14.6 oz)   SpO2 93%   BMI 27.42 kg/m  SpO2: SpO2: 93 % O2 Device: O2 Device: Not Delivered O2 Flow Rate: O2 Flow Rate (L/min): 2 L/min  Intake/output summary:   Intake/Output Summary (Last 24 hours) at 07/08/17 1532 Last  data filed at 07/08/17 1300  Gross per 24 hour  Intake              150 ml  Output              875 ml  Net             -725 ml   LBM: Last BM Date: 07/04/17 Baseline Weight: Weight: 81.6 kg (180 lb) Most recent weight: Weight: 68 kg (149 lb 14.6 oz)       Palliative Assessment/Data:      Patient Active Problem List   Diagnosis Date Noted  . Protein-calorie malnutrition, severe 07/04/2017  . Ascites, malignant   . Palliative care encounter   . Primary pancreatic cancer with metastasis to other site ([place]) 06/19/2017  . Encounter for palliative care   . Pancreatic mass   . Constipation   . Palliative care by specialist   . Abdominal pain, generalized   . Peritoneal carcinomatosis ([place]) 06/11/2017  . Nausea & vomiting 06/11/2017  . Intractable abdominal pain 06/11/2017  . HYPERLIPIDEMIA 10/21/2007  . Hypothyroidism 02/24/2007  . HYPERLIPIDEMIA 02/24/2007  . Essential hypertension 02/24/2007    Palliative Care Assessment & Plan   Patient Profile: 61 y.o. female  with past medical history of recently diagnosed metastatic cancer, diabetes, and thyroid disease who was admitted on 07/04/2017 with nausea, vomiting, and abdominal pain.  Sierra Beck is a patient with [facility].  Recommendations/Plan:  Pain:  Has used 20+mg over the last 24 hours.  Reports feeling better with basal rate started.  Will also continue bolus 0.'5mg'$  with 15 minute lockout.  Plan on discharge is for home PCA, but she understands that hospice will need to further titrate once she gets home.  Nausea: Continue reglan prior to meals  and zyprexa nightly.   Goals of Care and Additional Recommendations:  Limitations on Scope of Treatment: Full Comfort Care  Code Status:    Code Status Orders        Start     Ordered   07/04/17 0418  Do not attempt resuscitation (DNR)  Continuous    Question Answer Comment  In the event of cardiac or respiratory ARREST Do not call a "code blue"   In the event of cardiac or respiratory ARREST Do not perform Intubation, CPR, defibrillation or ACLS   In the event of cardiac or respiratory ARREST Use medication by any route, position, wound care, and other measures to relive pain and suffering. May use oxygen, suction and manual treatment of airway obstruction as needed for comfort.      07/04/17 0420    Code Status History    Date Active Date Inactive Code Status Order ID Comments User Context   06/11/2017  9:06 PM 06/19/2017  6:27 PM Full Code [id number]  Etta Quill, DO ED       Prognosis:   < 6 months  Discharge Planning:  Home with Hospice  Care plan was discussed with patient, RN, [facility] liaison  Thank you for allowing the Palliative Medicine Team to assist in the care of this patient.   Time In: 0900 Time Out: 0920 Total Time 20 Prolonged Time Billed No      Greater than 50%  of this time was spent counseling and coordinating care related to the above assessment and plan.  Micheline Rough, MD  Please contact Palliative Medicine Team phone at [phone] for questions and concerns.

## 2017-07-08 NOTE — Consult Note (Signed)
[facility]: Pt in room lying in bed. CADD pump has been delivered to the room and so are drainage bottles for plurex use. Pt states she was last drained 2 days ago when drain placed. She is requesting to be drained. Spoke to Nurse and my MD and pt was drained 875 clear yellow drainage from plurex drain. This was disposed of in biohazard room. Pt tolerated procedure well. Site cleaned and re-dressed. No sign of infection.  Her pain continues to be managed well with current PCA setting verified CADD pump with appropriate settings and doses on the bag are also verified to be appropriate. She was discontinued from the hospital pump and hooked to the CADD pump to transfer to her home.  This was hooked to her PICC line. RN Chelsea updated. Pt belongings packed up and she is sitting in her recliner chair dressed and eating lunch. Webb Silversmith RN [phone]

## 2017-07-08 NOTE — Progress Notes (Signed)
34mL dilaudid from d/c'd PCA syringe was wasted into sink with Harvest Dark RN as witness at this time.

## 2017-07-08 NOTE — Discharge Summary (Signed)
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 She presents for placement of a tunneled peritoneal drainage catheter as well as placement of a PICC. EXAM: IR PERC PLEURAL DRAIN W/INDWELL CATH W/IMG GUIDE; IR RIGHT FLOURO GUIDE CV LINE; IR ULTRASOUND GUIDANCE VASC ACCESS RIGHT MEDICATIONS: 2 g Ancef were administered intravenously within 1 hour of skin incision. ANESTHESIA/SEDATION: Fentanyl 100 mcg IV; Versed 2 mg IV Moderate Sedation Time:  37 minutes The patient was continuously monitored during the procedure by the interventional radiology nurse under my direct supervision. FLUOROSCOPY TIME:  5 minutes 42 seconds for a total of 37.1 mGy COMPLICATIONS: None immediate. PROCEDURE: Informed written consent was obtained from the patient after a thorough discussion of the procedural risks, benefits and alternatives. All questions were addressed. Maximal Sterile Barrier Technique was utilized including caps, mask, sterile gowns, sterile gloves, sterile drape, hand hygiene and skin antiseptic. A timeout was performed prior to the initiation of the procedure. PICC PLACEMENT Attention was first turned to the right arm. The  medial aspect of the right upper arm was sterilely prepped and draped in standard fashion using chlorhexidine skin prep. A tourniquet was applied. The arm was interrogated with ultrasound. The brachial vein is widely patent. An image was obtained and stored for the medical record. Local anesthesia was attained by infiltration with 1% lidocaine. Under real-time sonographic guidance, the right brachial vein was punctured using a 21 gauge micropuncture needle. A wire was advanced into the central venous vasculature in the needle was exchanged for a peel-away sheath. Unfortunately, the wire could not be navigated into the heart. The wire continued to buckle superiorly into the internal jugular vein. Fear Ing a central venous occlusion, and limited right upper extremity venogram was performed. The subclavian vein and superior vena cava are patent. The anatomy is just highly tortuous. Therefore, a 5 French angled catheter was advanced through the peel-away sheath an used to navigate the wire into the right heart. A 5 French dual-lumen power injectable PICC was then cut to 33 cm in  advanced over the wire. The tip of the catheter is positioned at the superior cavoatrial junction. The catheter flushes and aspirates was ease. It was flushed with heparinized saline and secured to the skin with an adhesive fixation device. PLEUR-X PLACEMENT Attention was next turned to the abdomen. The right abdomen was interrogated with ultrasound. There is moderate volume ascites. A suitable skin entry site was selected and marked. Following sterile prep and draped using chlorhexidine skin prep, local anesthesia was attained by infiltration with 1% lidocaine. A small dermatotomy was made. An 18 gauge sheath needle was then advanced through the dermatotomy and into the peritoneal space with return of yellow ascitic fluid. A 0.035 inch wire was then advanced through the sheath needle and into the peritoneal cavity. A small dermatotomy was made  approximately 5 cm medial and slightly superior to the peritoneal entry site. This will serve as the catheter exit site. The PleurX catheter was then tunneled retrograde from the skin exit site to the dermatotomy overlying the peritoneal access site. A peel-away sheath was then advanced over the wire and into the peritoneal space. The PleurX catheter was advanced through the peel-away sheath and the peel-away sheath was discarded. The catheter was connected to suction and a paracentesis was performed removing 1.6 L of fluid. The catheter was secured to the skin with 2-0 Prolene suture. The dermatotomy overlying the peritoneal access site was closed with an inverted interrupted 3-0 Vicryl suture in the epidermis sealed with derma bond. The patient tolerated the procedure well. IMPRESSION: 1. Placement of a right arm dual-lumen power PICC. The catheter tip is located at the superior cavoatrial junction. 2. Placement of a right abdominal tunneled peritoneal drainage catheter. Electronically Signed   By: Jacqulynn Cadet M.D.   On: 07/05/2017 14:11    Microbiology: No results found for this or any previous visit (from the past 240 hour(s)).   Labs: Basic Metabolic Panel:  Recent Labs Lab 07/04/17 0237  NA 131*  K 3.5  CL 91*  CO2 25  GLUCOSE 115*  BUN 29*  CREATININE 0.88  CALCIUM 8.6*   Liver Function Tests:  Recent Labs Lab 07/04/17 0237  AST 20  ALT 15  ALKPHOS 142*  BILITOT 1.4*  PROT 6.4*  ALBUMIN 2.8*   No results for input(s): LIPASE, AMYLASE in the last 168 hours. No results for input(s): AMMONIA in the last 168 hours. CBC:  Recent Labs Lab 07/04/17 0237  WBC 10.2  NEUTROABS 7.5  HGB 12.1  HCT 36.3  MCV 85.6  PLT 294   Cardiac Enzymes: No results for input(s): CKTOTAL, CKMB, CKMBINDEX, TROPONINI in the last 168 hours. BNP: BNP (last 3 results) No results for input(s): BNP in the last 8760 hours.  ProBNP (last 3 results) No results for input(s): PROBNP in the  last 8760 hours.  CBG: No results for input(s): GLUCAP in the last 168 hours.   Signed:  Velvet Bathe MD.  [facility] 07/08/2017, 3:11 PM

## 2017-07-08 NOTE — Consult Note (Signed)
[place]: Spoke to the nurse for today Vikki Ports who was able to confirm that pt was being discharged home today. [facility] is to be delivering the hospital bed, OBT and oxygen to the home today. This has been confirmed with the pt's sister and she will be receiving it at home. The nurse was able to give the exact detail of medication that pt was receiving via her PCA at hospital. My doctor-- Dr. Elinor Parkinson has written new script and I have faxed it to [facility]. They will fill the script and be delivering the CADD pump to the hospital (pt room) around 100pm today for pt to transfer home comfortably. I will meet pt there and assist with transfer over to our home care pump and pt will need to go home by ambulance. Webb Silversmith RN [phone]   Please call with any questions or concerns.

## 2017-07-09 ENCOUNTER — Ambulatory Visit

## 2017-07-09 ENCOUNTER — Other Ambulatory Visit

## 2017-07-09 ENCOUNTER — Ambulatory Visit: Admitting: Nurse Practitioner

## 2017-07-30 ENCOUNTER — Ambulatory Visit: Admitting: Gastroenterology

## 2017-07-31 ENCOUNTER — Telehealth: Payer: Self-pay | Admitting: Hematology

## 2017-07-31 ENCOUNTER — Telehealth: Payer: Self-pay | Admitting: *Deleted

## 2017-07-31 NOTE — Telephone Encounter (Signed)
Per 11/6 staff message from desk nurse patient expired 11/6. Status updated,

## 2017-07-31 NOTE — Telephone Encounter (Signed)
Received notice from [facility] re:  Pt expired  August 01, 2017  At  1927 pm.

## 2017-08-24 DEATH — deceased
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
